# Patient Record
Sex: Male | Born: 1950 | Race: White | Hispanic: No | Marital: Married | Smoking: Former smoker
Health system: Southern US, Community
[De-identification: ages and names within clinical notes are randomized; demographics above are authoritative.]

## PROBLEM LIST (undated history)

## (undated) DIAGNOSIS — I10 Essential (primary) hypertension: Secondary | ICD-10-CM

## (undated) DIAGNOSIS — M1990 Unspecified osteoarthritis, unspecified site: Secondary | ICD-10-CM

## (undated) DIAGNOSIS — G5603 Carpal tunnel syndrome, bilateral upper limbs: Secondary | ICD-10-CM

## (undated) DIAGNOSIS — R7303 Prediabetes: Secondary | ICD-10-CM

## (undated) DIAGNOSIS — R918 Other nonspecific abnormal finding of lung field: Secondary | ICD-10-CM

## (undated) HISTORY — DX: Carpal tunnel syndrome, bilateral upper limbs: G56.03

## (undated) HISTORY — DX: Other nonspecific abnormal finding of lung field: R91.8

---

## 2009-07-28 ENCOUNTER — Encounter: Admission: RE | Admit: 2009-07-28 | Discharge: 2009-07-28 | Payer: Self-pay | Admitting: Family Medicine

## 2009-08-08 ENCOUNTER — Ambulatory Visit: Payer: Self-pay | Admitting: Internal Medicine

## 2009-08-08 DIAGNOSIS — J4489 Other specified chronic obstructive pulmonary disease: Secondary | ICD-10-CM | POA: Insufficient documentation

## 2009-08-08 DIAGNOSIS — R222 Localized swelling, mass and lump, trunk: Secondary | ICD-10-CM

## 2009-08-08 DIAGNOSIS — J189 Pneumonia, unspecified organism: Secondary | ICD-10-CM

## 2009-08-08 DIAGNOSIS — J449 Chronic obstructive pulmonary disease, unspecified: Secondary | ICD-10-CM

## 2009-08-08 DIAGNOSIS — E785 Hyperlipidemia, unspecified: Secondary | ICD-10-CM

## 2009-08-11 ENCOUNTER — Encounter: Payer: Self-pay | Admitting: Internal Medicine

## 2009-08-11 ENCOUNTER — Ambulatory Visit: Admission: RE | Admit: 2009-08-11 | Discharge: 2009-08-11 | Payer: Self-pay | Admitting: Internal Medicine

## 2009-08-11 ENCOUNTER — Ambulatory Visit: Payer: Self-pay | Admitting: Internal Medicine

## 2009-08-15 ENCOUNTER — Telehealth: Payer: Self-pay | Admitting: Internal Medicine

## 2009-10-09 ENCOUNTER — Telehealth (INDEPENDENT_AMBULATORY_CARE_PROVIDER_SITE_OTHER): Payer: Self-pay | Admitting: *Deleted

## 2009-10-18 ENCOUNTER — Ambulatory Visit (HOSPITAL_COMMUNITY): Admission: RE | Admit: 2009-10-18 | Discharge: 2009-10-18 | Payer: Self-pay | Admitting: Internal Medicine

## 2009-10-25 ENCOUNTER — Encounter: Payer: Self-pay | Admitting: Internal Medicine

## 2009-10-25 ENCOUNTER — Ambulatory Visit: Payer: Self-pay | Admitting: Thoracic Surgery

## 2009-10-30 ENCOUNTER — Ambulatory Visit (HOSPITAL_COMMUNITY): Admission: RE | Admit: 2009-10-30 | Discharge: 2009-10-30 | Payer: Self-pay | Admitting: Thoracic Surgery

## 2009-11-03 ENCOUNTER — Encounter: Payer: Self-pay | Admitting: Thoracic Surgery

## 2009-11-03 ENCOUNTER — Inpatient Hospital Stay (HOSPITAL_COMMUNITY): Admission: RE | Admit: 2009-11-03 | Discharge: 2009-11-07 | Payer: Self-pay | Admitting: Thoracic Surgery

## 2009-11-03 ENCOUNTER — Ambulatory Visit: Payer: Self-pay | Admitting: Thoracic Surgery

## 2009-11-14 ENCOUNTER — Ambulatory Visit: Payer: Self-pay | Admitting: Thoracic Surgery

## 2009-11-14 ENCOUNTER — Encounter: Admission: RE | Admit: 2009-11-14 | Discharge: 2009-11-14 | Payer: Self-pay | Admitting: Thoracic Surgery

## 2009-12-06 ENCOUNTER — Ambulatory Visit: Payer: Self-pay | Admitting: Thoracic Surgery

## 2009-12-06 ENCOUNTER — Encounter: Admission: RE | Admit: 2009-12-06 | Discharge: 2009-12-06 | Payer: Self-pay | Admitting: Thoracic Surgery

## 2010-12-09 ENCOUNTER — Encounter: Payer: Self-pay | Admitting: Thoracic Surgery

## 2010-12-09 ENCOUNTER — Encounter: Payer: Self-pay | Admitting: Internal Medicine

## 2010-12-25 ENCOUNTER — Other Ambulatory Visit (HOSPITAL_COMMUNITY): Payer: Self-pay | Admitting: Urology

## 2010-12-25 DIAGNOSIS — C61 Malignant neoplasm of prostate: Secondary | ICD-10-CM

## 2011-01-18 ENCOUNTER — Other Ambulatory Visit (HOSPITAL_COMMUNITY): Payer: Self-pay

## 2011-02-18 LAB — GLUCOSE, CAPILLARY
Glucose-Capillary: 109 mg/dL — ABNORMAL HIGH (ref 70–99)
Glucose-Capillary: 110 mg/dL — ABNORMAL HIGH (ref 70–99)
Glucose-Capillary: 123 mg/dL — ABNORMAL HIGH (ref 70–99)
Glucose-Capillary: 133 mg/dL — ABNORMAL HIGH (ref 70–99)
Glucose-Capillary: 136 mg/dL — ABNORMAL HIGH (ref 70–99)

## 2011-02-18 LAB — COMPREHENSIVE METABOLIC PANEL
ALT: 19 U/L (ref 0–53)
Alkaline Phosphatase: 60 U/L (ref 39–117)
BUN: 3 mg/dL — ABNORMAL LOW (ref 6–23)
CO2: 27 mEq/L (ref 19–32)
GFR calc non Af Amer: >60 mL/min (ref >60)
Glucose, Bld: 132 mg/dL — ABNORMAL HIGH (ref 70–99)
Potassium: 3.5 mEq/L (ref 3.5–5.1)
Sodium: 133 mEq/L — ABNORMAL LOW (ref 135–145)

## 2011-02-18 LAB — CBC
HCT: 40 % (ref 39.0–52.0)
Hemoglobin: 13.5 g/dL (ref 13.0–17.0)
Hemoglobin: 13.6 g/dL (ref 13.0–17.0)
MCHC: 34 g/dL (ref 30.0–36.0)
MCV: 90 fL (ref 78.0–100.0)
Platelets: 199 10*3/uL (ref 150–400)
RBC: 4.43 MIL/uL (ref 4.22–5.81)
RBC: 4.44 MIL/uL (ref 4.22–5.81)
RBC: 4.51 MIL/uL (ref 4.22–5.81)
RDW: 13.6 % (ref 11.5–15.5)
RDW: 14.2 % (ref 11.5–15.5)
WBC: 9.8 10*3/uL (ref 4.0–10.5)

## 2011-02-18 LAB — POCT I-STAT 3, ART BLOOD GAS (G3+)
Patient temperature: 98.6
TCO2: 29 mmol/L (ref 0–100)
pCO2 arterial: 47.8 mmHg — ABNORMAL HIGH (ref 35.0–45.0)
pH, Arterial: 7.372 (ref 7.350–7.450)
pO2, Arterial: 54 mmHg — ABNORMAL LOW (ref 80.0–100.0)

## 2011-02-18 LAB — BASIC METABOLIC PANEL
CO2: 25 mEq/L (ref 19–32)
Calcium: 8.1 mg/dL — ABNORMAL LOW (ref 8.4–10.5)
GFR calc Af Amer: >60 mL/min (ref >60)
GFR calc Af Amer: >60 mL/min (ref >60)
GFR calc non Af Amer: >60 mL/min (ref >60)
Potassium: 4.2 mEq/L (ref 3.5–5.1)
Sodium: 136 mEq/L (ref 135–145)
Sodium: 137 mEq/L (ref 135–145)

## 2011-02-18 LAB — TYPE AND SCREEN
ABO/RH(D): A POS
Antibody Screen: NEGATIVE

## 2011-02-18 LAB — MRSA PCR SCREENING: MRSA by PCR: NEGATIVE

## 2011-02-19 LAB — COMPREHENSIVE METABOLIC PANEL
ALT: 34 U/L (ref 0–53)
AST: 21 U/L (ref 0–37)
Albumin: 3.9 g/dL (ref 3.5–5.2)
Alkaline Phosphatase: 62 U/L (ref 39–117)
BUN: 14 mg/dL (ref 6–23)
CO2: 21 mEq/L (ref 19–32)
Calcium: 9.2 mg/dL (ref 8.4–10.5)
Chloride: 106 mEq/L (ref 96–112)
Creatinine, Ser: 0.83 mg/dL (ref 0.4–1.5)
GFR calc Af Amer: >60 mL/min (ref >60)
GFR calc non Af Amer: >60 mL/min (ref >60)
Glucose, Bld: 113 mg/dL — ABNORMAL HIGH (ref 70–99)
Potassium: 4.4 mEq/L (ref 3.5–5.1)
Sodium: 135 mEq/L (ref 135–145)
Total Bilirubin: 0.6 mg/dL (ref 0.3–1.2)
Total Protein: 6.3 g/dL (ref 6.0–8.3)

## 2011-02-19 LAB — URINALYSIS, ROUTINE W REFLEX MICROSCOPIC
Bilirubin Urine: NEGATIVE
Ketones, ur: NEGATIVE mg/dL
Nitrite: NEGATIVE
Protein, ur: NEGATIVE mg/dL
Urobilinogen, UA: 0.2 mg/dL (ref 0.0–1.0)
pH: 6 (ref 5.0–8.0)

## 2011-02-19 LAB — BLOOD GAS, ARTERIAL
Bicarbonate: 24.9 mEq/L — ABNORMAL HIGH (ref 20.0–24.0)
FIO2: 0.21 %
Patient temperature: 98.6
pH, Arterial: 7.402 (ref 7.350–7.450)

## 2011-02-19 LAB — APTT: aPTT: 30 seconds (ref 24–37)

## 2011-02-19 LAB — CBC
HCT: 47.4 % (ref 39.0–52.0)
Hemoglobin: 16.2 g/dL (ref 13.0–17.0)
MCHC: 34.1 g/dL (ref 30.0–36.0)
MCV: 89.3 fL (ref 78.0–100.0)
Platelets: 224 10*3/uL (ref 150–400)
RBC: 5.31 MIL/uL (ref 4.22–5.81)
RDW: 13.9 % (ref 11.5–15.5)
WBC: 9.5 10*3/uL (ref 4.0–10.5)

## 2011-04-02 NOTE — Letter (Signed)
October 25, 2009   Rodney Frey. Sherene Sires, MD, FCCP  [address]  [place], [place] [address]   Re:  Rodney Frey, Rodney Frey               DOB:  02/29/1951   Dear Kathlene November:   I appreciate the opportunity of seeing the patient.  This 60 year old  man is a smoker that he smokes 1 pack per day.  In late August, he had a  chest x-ray that showed abnormal area in his right middle lobe and  underwent a CT scan, which showed a right middle lobe lesion.  He had a  bronchoscopy done on August 11, 2009.  On the CT scan, there is a  question of a hepatic nodule.  The bronchoscopy revealed neuroendocrine  neoplasm versus probably a low-grade or carcinoid-type tumor.  He then  underwent a PET scan, which was done on October 18, 2009, and then he  was referred to [place] for evaluation.  The PET scan showed moderate  hypermetabolic right middle lobe mass consistent with a neoplasm.  Both  left and right paratracheal node activities and no uptake in the liver  lesion.  His mass having a standard uptake value of 4.3.  He has had no  hemoptysis, fever, chills, or excessive sputum.   MEDICATIONS:  He has been on no medication.  He was on Pravachol.   ALLERGIES:  He has no allergies.   PAST MEDICAL HISTORY:  He told he has had hyperlipidemia and a history  of pneumonia.   FAMILY HISTORY:  Noncontributory.   SOCIAL HISTORY:  He is married.  Drinks beer 3 times a week, smokes 1  pack of cigarettes per day.   REVIEW OF SYSTEMS:  VITAL SIGNS:  He is 185 pounds.  He is 5 foot 8  inches.  GENERAL:  His weight has been stable.  CARDIAC:  No angina or atrial fibrillation.  PULMONARY:  No hemoptysis, asthma, or wheezing.  See history of present  illness.  GI:  No nausea, vomiting, constipation, or diarrhea.  GU:  No kidney disease, dysuria, or frequent urination.  VASCULAR:  No claudication, DVT, or TIAs.  NEUROLOGICAL:  No dizziness, headaches, blackouts, or seizures.  MUSCULOSKELETAL:  No arthritis.  PSYCHIATRIC:   No depression or nervous.  ENT:  No changes in eyesight or hearing.  HEMATOLOGIC:  No problems with bleeding, clotting disorders, or anemia.   PHYSICAL EXAMINATION:  General:  A well-developed Caucasian male, in no  acute distress.  Head, Eyes, Ears, Nose, and Throat:  Unremarkable.  He  does have a full beard.  Neck:  Supple without thyromegaly.  There is no  supraclavicular or axillary adenopathy.  Chest:  Clear to auscultation  and percussion.  Heart:  Regular sinus rhythm.  No murmurs.  Abdomen:  Soft.  There is no hepatosplenomegaly.  Extremities:  Pulses are 2+.  There is no clubbing or edema.  Neurological:  He is oriented x3.  Sensory and motor intact.  Cranial nerves intact.   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  I hope that there has been no metastatic spread to his lymph nodes given  that this has been there for a while, and that there is low uptake on  his PET scan.  One of the case is the procedure towards his right middle  lobectomy with extensive lymph node dissection.  This is very poorly  responsive to chemotherapy and/or radiation.  I discussed  this in detail  with the patient who is elected to do a surgery on November 03, 2009 at  [facility].   Sincerely,   Ines Bloomer, M.D.  Electronically Signed   DPB/MEDQ  D:  10/25/2009  T:  10/26/2009  Job:  [id number]

## 2011-04-02 NOTE — Assessment & Plan Note (Signed)
OFFICE VISIT   Rodney Frey, Rodney Frey  DOB:  10/29/1951                                        December 06, 2009  CHART #:  [id number]   The patient came for followup today.  His incision is well healed.  His  pain has decreased and I have released him to full activity.  His blood  pressure was 134/84, pulse 82, respirations 18, and sats were 93%.  His  lungs are clear to auscultation and percussion and is in normal sinus  rhythm.  Overall, he is doing well; taking occasional pain pill for some  mild pain.  We will see him back again in 2 months with a chest x-ray.   Ines Bloomer, M.D.  Electronically Signed   DPB/MEDQ  D:  12/06/2009  T:  12/06/2009  Job:  [id number]

## 2011-04-05 NOTE — Letter (Signed)
November 15, 2009   Casimiro Needle B. Sherene Sires, MD, FCCP  [address]  [place], [place] [address]   Re:  LAVALLE, SKODA               DOB:  Feb 02, 1951   Dear Kathlene November,   I saw the patient back today after his right middle lobe VATS, right  middle lobectomy.  He is doing well, removed his chest tube sutures.  His blood pressure is 131/85, pulse 68, respirations 18, sats were 97%.  The chest x-ray as I said was stable.  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  We told him to gradually increase his  activities, and we will see him back again in 3 weeks with a chest x-  ray.   Ines Bloomer, M.D.  Electronically Signed   DPB/MEDQ  D:  11/15/2009  T:  11/16/2009  Job:  [id number]

## 2011-10-24 IMAGING — CR DG CHEST 2V
2 series · 2 of 2 positions shown · non-contrast
Comparison: CT 07/28/2009.

CLINICAL DATA: History of carcinoid of the middle lobe of the right
lung.  History of tobacco smoking. Preoperative cardiopulmonary
evaluation.

CHEST - 2 VIEW

[view not recorded (1 of 2)]
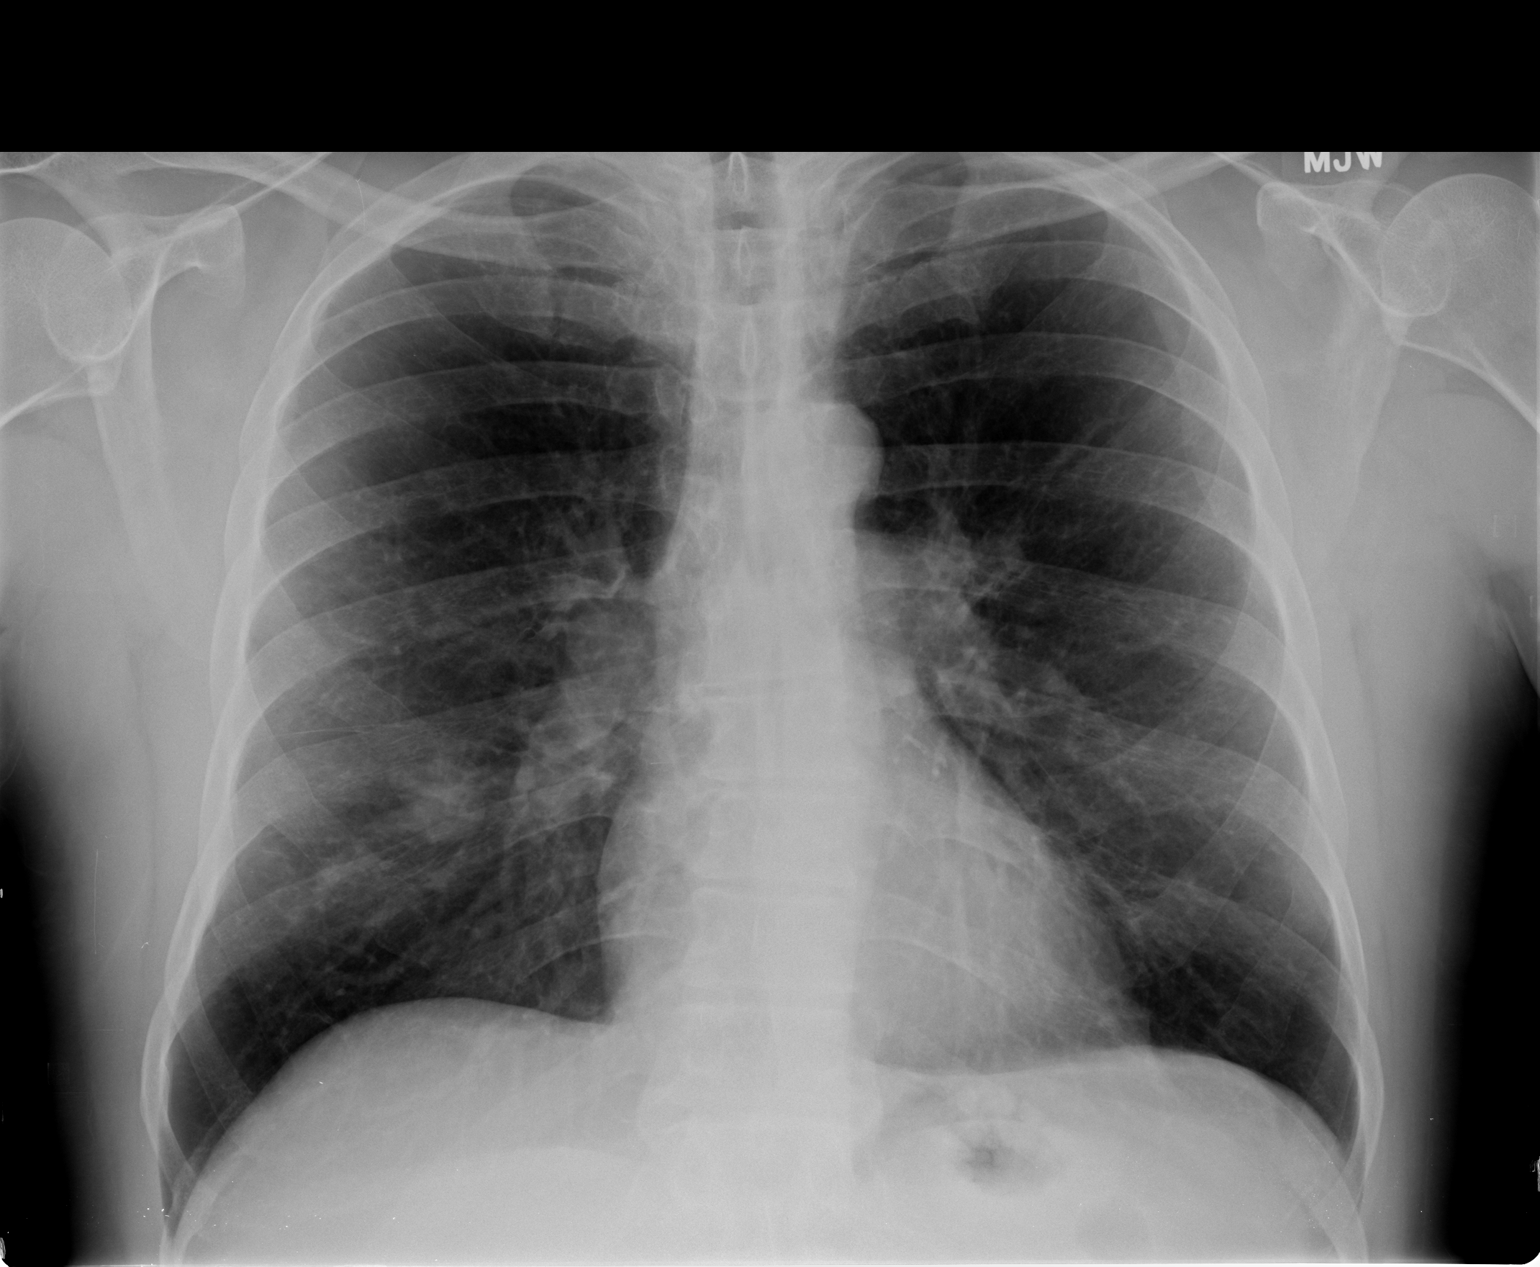

[view not recorded (2 of 2)]
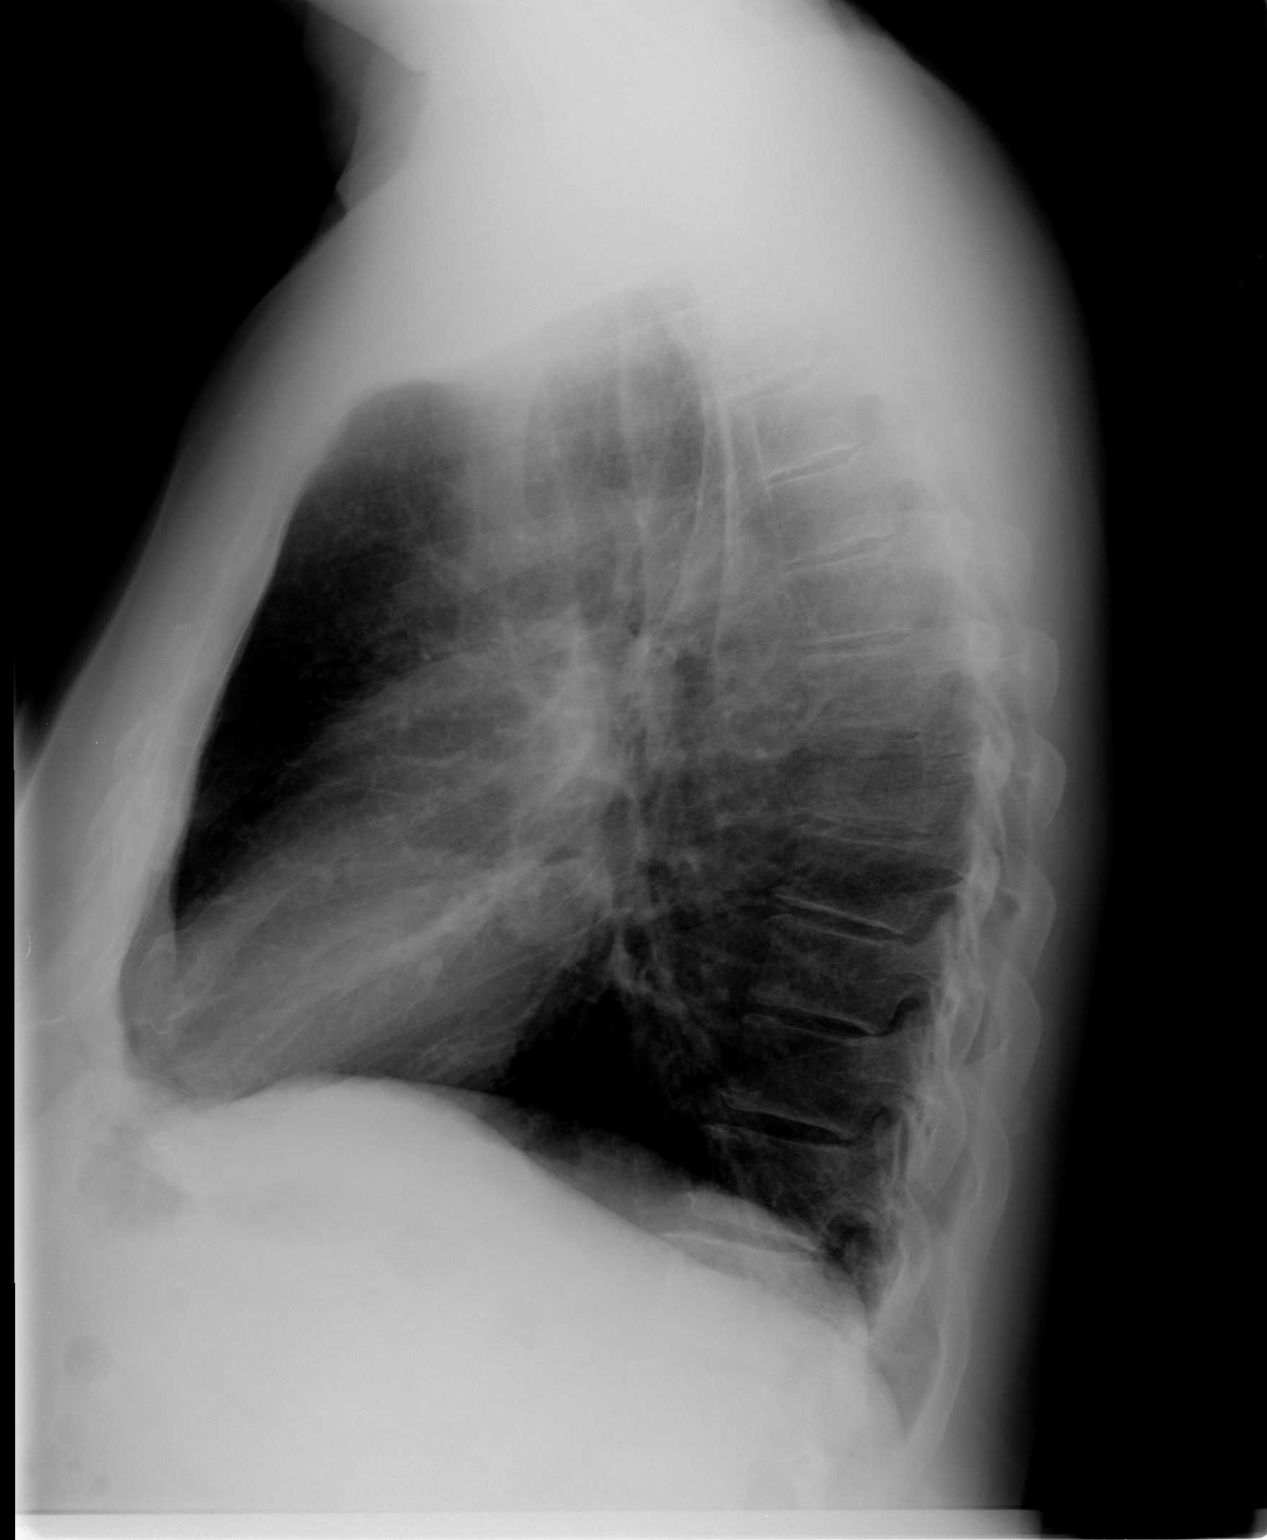

[2 of 2 positions shown; findings below may reference images not displayed]

FINDINGS: Nodular opacity is seen in the right middle lobe
medially.  On the PA image it measures 2.9 x 2.4 cm.  On the
lateral images is seen and measures 3.3 x 2.6 cm.  On previous CT
examination of 07/28/2009 was measured as 3.8 x 2.1 x 1.8 cm.  No
pulmonary edema, pneumonia, or pleural effusion is evident.  There
is slight flattening of the diaphragm on lateral image with
hyperinflation and evidence for obstructive pulmonary disease.  No
skeletal lesion is seen.
IMPRESSION: Nodular mass density in right middle lobe is again seen. The lungs
appear hyperinflated consistent with obstructive pulmonary disease.

## 2011-11-19 HISTORY — PX: LUNG SURGERY: SHX703

## 2012-04-23 ENCOUNTER — Ambulatory Visit (INDEPENDENT_AMBULATORY_CARE_PROVIDER_SITE_OTHER): Payer: 59 | Admitting: Family Medicine

## 2012-04-23 ENCOUNTER — Ambulatory Visit: Payer: 59

## 2012-04-23 VITALS — BP 158/98 | HR 60 | Temp 98.1°F | Resp 18 | Ht 69.0 in | Wt 174.0 lb

## 2012-04-23 DIAGNOSIS — M25539 Pain in unspecified wrist: Secondary | ICD-10-CM

## 2012-04-23 DIAGNOSIS — M25531 Pain in right wrist: Secondary | ICD-10-CM

## 2012-04-23 DIAGNOSIS — R200 Anesthesia of skin: Secondary | ICD-10-CM

## 2012-04-23 DIAGNOSIS — M79671 Pain in right foot: Secondary | ICD-10-CM

## 2012-04-23 DIAGNOSIS — M25511 Pain in right shoulder: Secondary | ICD-10-CM

## 2012-04-23 DIAGNOSIS — G629 Polyneuropathy, unspecified: Secondary | ICD-10-CM

## 2012-04-23 DIAGNOSIS — M25579 Pain in unspecified ankle and joints of unspecified foot: Secondary | ICD-10-CM

## 2012-04-23 DIAGNOSIS — G609 Hereditary and idiopathic neuropathy, unspecified: Secondary | ICD-10-CM

## 2012-04-23 DIAGNOSIS — M25519 Pain in unspecified shoulder: Secondary | ICD-10-CM

## 2012-04-23 LAB — TSH: TSH: 2.867 u[IU]/mL (ref 0.350–4.500)

## 2012-04-23 LAB — VITAMIN B12: Vitamin B-12: 490 pg/mL (ref 211–911)

## 2012-04-23 MED ORDER — TRAMADOL HCL 50 MG PO TABS: 50.0000 mg | ORAL_TABLET | Freq: Four times a day (QID) | ORAL | Status: AC | PRN

## 2012-04-23 NOTE — Patient Instructions (Addendum)
Keep a record of your blood pressures outside of the office and bring them to the next office visit. Tylenol as needed for pain, or if needing something stronger - can take tramadol up to every 6 hours as needed.  Recheck in next 1 week, sooner if any worsening.    Wrist brace as needed, exercises and range of motion exercises for shoulder.

## 2012-04-23 NOTE — Progress Notes (Signed)
  Subjective:    Patient ID: Rodney Frey, male    DOB: 1951/01/01, 61 y.o.   MRN: [id number]  HPI Rodney Frey is a 60 y.o. male  R ankle and R  Foot pain - felt like was swollen few months ago. NKI.  swellling initally in ankle and toe area. Pain on and off or few months.  Feels worse today.  R knee pain - 3 months ago - squatting down  - felt a pop when standing up.  Knee pain - improved, but foot feels worse. No known hx of Gout. Foot numb in toes, ball of footr and heel area. Pain in front of ankle with sitting, pain in ankle with standing, and sore behind ankle when walking.      No known hx of HTN, but no primary care doctor.  No prior eval for any of current issues. no recent blood work or physical - last one here 2 years ago.    R wrist tendonitis/pain - for years,  No recent injury.  Had scaphoid fx 40 years ago. 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  Able to lift now, but sore with certain mvmts at times.  R hand dominant.  No hx of seizure d/o.  Tx:   Advil, otc alleve as needed - about once every 2 days.    SH: 1 pack per day, manufacturing - on feet all day.    Review of Systems  Per hpi.       Objective:   Physical Exam  Constitutional: He appears well-developed and well-nourished.  HENT:  Head: Normocephalic and atraumatic.  Pulmonary/Chest: Effort normal.  Musculoskeletal:       Right shoulder: He exhibits normal range of motion, no tenderness and no bony tenderness.       Right wrist: He exhibits decreased range of motion. He exhibits no tenderness, no bony tenderness, no swelling and no effusion.       Right ankle: He exhibits normal range of motion and no ecchymosis. No lateral malleolus and no medial malleolus tenderness found. Achilles tendon normal. Achilles tendon exhibits no pain and no defect.       Arms:      Feet:    [facility] reading (PRIMARY) by  Dr. Neva Seat:  R shoulder: NAD R  Wrist:? Subacute/remote scaphoid fx. R ankle/foot:NAD  Results for orders placed in visit on 04/23/12  GLUCOSE, POCT (MANUAL RESULT ENTRY)      Component Value Range   POC Glucose 84  70 - 99 (mg/dl)        Assessment & Plan:  Rodney Frey is a 61 y.o. male  R foot/ankle pain with dyesthesias.  Exam concerning for peripheral vascular disease as cause of neuropathy.  Will check TSH, B12 level, uric acid, and refer for ABI's.  Recheck next 2 weeks.  Advised on cutting back on smoking and need for cessation, especially if PVD.    R wrist pain - likely underlying OA with prior scaphoid fx.with secondary tendonitis. Trial of brace during work, rom, ice/heat at night.  Hold on NSAIDS for now with elevated BP.  R shoulder pain - ? RTC tendonitis.  H/o on HEp.  rom exercises. Tylenol or ultram prn and recheck next few weeks.    Elevated BP without hx HTN - check outside bp's and recheck next week.

## 2012-04-24 ENCOUNTER — Ambulatory Visit
Admission: RE | Admit: 2012-04-24 | Discharge: 2012-04-24 | Disposition: A | Discharge status: Home / Self Care | Payer: 59 | Source: Ambulatory Visit | Attending: Family Medicine | Admitting: Family Medicine

## 2012-04-24 ENCOUNTER — Other Ambulatory Visit: Payer: Self-pay | Admitting: Family Medicine

## 2012-04-24 DIAGNOSIS — G629 Polyneuropathy, unspecified: Secondary | ICD-10-CM

## 2012-05-05 ENCOUNTER — Telehealth: Payer: Self-pay

## 2012-05-05 NOTE — Telephone Encounter (Signed)
Patient requesting call back from Dr. Neva Seat to discuss results of arterial ultrasound.

## 2012-05-05 NOTE — Telephone Encounter (Signed)
Spoke with patient and discussed results, advised follow-up with Dr. Neva Seat to advise further.  Patient will try and RTC Thursday.

## 2013-12-18 ENCOUNTER — Ambulatory Visit (INDEPENDENT_AMBULATORY_CARE_PROVIDER_SITE_OTHER): Payer: 59 | Admitting: Internal Medicine

## 2013-12-18 ENCOUNTER — Ambulatory Visit: Payer: 59

## 2013-12-18 VITALS — BP 124/82 | HR 86 | Temp 98.1°F | Resp 16 | Ht 65.5 in | Wt 148.2 lb

## 2013-12-18 DIAGNOSIS — M25512 Pain in left shoulder: Principal | ICD-10-CM

## 2013-12-18 DIAGNOSIS — J449 Chronic obstructive pulmonary disease, unspecified: Secondary | ICD-10-CM

## 2013-12-18 DIAGNOSIS — J9801 Acute bronchospasm: Secondary | ICD-10-CM

## 2013-12-18 DIAGNOSIS — M25511 Pain in right shoulder: Principal | ICD-10-CM

## 2013-12-18 DIAGNOSIS — G8929 Other chronic pain: Secondary | ICD-10-CM

## 2013-12-18 DIAGNOSIS — J209 Acute bronchitis, unspecified: Secondary | ICD-10-CM

## 2013-12-18 DIAGNOSIS — J4489 Other specified chronic obstructive pulmonary disease: Secondary | ICD-10-CM

## 2013-12-18 DIAGNOSIS — M25519 Pain in unspecified shoulder: Secondary | ICD-10-CM

## 2013-12-18 DIAGNOSIS — F17200 Nicotine dependence, unspecified, uncomplicated: Secondary | ICD-10-CM

## 2013-12-18 MED ORDER — PREDNISONE 10 MG PO TABS: 10.0000 mg | ORAL_TABLET | Freq: Every day | ORAL | Status: DC

## 2013-12-18 MED ORDER — MELOXICAM 15 MG PO TABS: 15.0000 mg | ORAL_TABLET | Freq: Every day | ORAL | Status: DC

## 2013-12-18 MED ORDER — HYDROCODONE-HOMATROPINE 5-1.5 MG/5ML PO SYRP: 5.0000 mL | ORAL_SOLUTION | Freq: Four times a day (QID) | ORAL | Status: DC | PRN

## 2013-12-18 MED ORDER — ALBUTEROL SULFATE HFA 108 (90 BASE) MCG/ACT IN AERS: 2.0000 | INHALATION_SPRAY | Freq: Four times a day (QID) | RESPIRATORY_TRACT | Status: DC | PRN

## 2013-12-18 NOTE — Progress Notes (Signed)
Subjective:    Patient ID: Rodney Frey, male    DOB: November 24, 1950, 64 y.o.   MRN: [id number] This chart was scribed for Rodney Lin, MD by Vernell Barrier, Medical Scribe. This patient's care was started at 3:00 PM.  Shoulder Pain    HPI Comments: Rodney Frey is a 63 y.o. male who presents to the [facility] complaining of right shoulder pain w/ some occasional numbness and tingling Pt states he has had chronic bursitis for 2 years. Pain initially started in the left shoulder but has since calmed down; denies any injury or specific incident that cause this pain. Right shoulder started 2 years ago after pt went to lift a gallon of milk.Yesterday, pt states left shoulder pain was worse than right; rating pain a 9/10. Pt has to sleep a certain position to avoid recreating pain. Pt admits he has lost some stregth in both shoulders since initial pain began. Pt says he is unable to abduct above the shoulder. Pain when lifting right arm but states pain subsides once he has it lifted. Pt was taking an herbal medication for 6 weeks that was providing significant relief, until 1 day ago when pain became unbearable. 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  Pt also has right wrist pain. Pt has difficulty performing flexion and extension.  Pt also reports congestion, sinus HA, productive cough, onset 10 days ago. Took OTC medication and feels relief but states congestion is still present. Pt is a chronic smoker.   Review of Systems  Objective:   Physical Exam  Vitals reviewed. Constitutional: He is oriented to person, place, and time. He appears well-developed and well-nourished. No distress.  HENT:  Head: Normocephalic and atraumatic.  Nose: Nose normal.  Mouth/Throat: Oropharynx is clear and moist.  Eyes: Conjunctivae and EOM are normal.  Neck: Neck supple. No thyromegaly present.  Cardiovascular: Normal rate.   Pulmonary/Chest: Effort normal. No respiratory  distress. He has wheezes. He has no rales.  Mild wheezing with forced expiration only  Musculoskeletal: Normal range of motion.       Right shoulder: He exhibits decreased strength.  Both shoulders have limited ROM.   RIGHT: No pain with external rotation. Pain with internal rotation and abduction. No sensory or motor loss of right hand.   LEFT: Similar findings as right but not as intense  Lymphadenopathy:    He has no cervical adenopathy.  Neurological: He is alert and oriented to person, place, and time.  Skin: Skin is warm and dry.  Psychiatric: He has a normal mood and affect. His behavior is normal.   [facility] reading (PRIMARY) by Dr. Laney Pastor: both shoulders appear normal without evidence of degenerative changes.   Assessment & Plan:  I have completed the patient encounter in its entirety as documented by the scribe, with editing by me where necessary. Alphonsus Doyel P. Laney Pastor, M.D. Chronic pain of both shoulders - Ambulatory referral to Orthopedic Surgery--- he will require extensive physical therapy and possibly injections/there may be a tear in the right rotator cuff  Nicotine addiction--he's not interested in stopping  Acute bronchospasm--- combination illness plus COPD from smoking  COPD UNSPECIFIED  Acute bronchitis--acute viral  Meds ordered this encounter  Medications  . predniSONE (DELTASONE) 10 MG tablet    Sig: Take 1 tablet (10 mg total) by mouth daily with breakfast.    Dispense:  12 tablet    Refill:  0  . meloxicam (MOBIC) 15 MG tablet  Sig: Take 1 tablet (15 mg total) by mouth daily.    Dispense:  30 tablet    Refill:  0  . albuterol (PROVENTIL HFA;VENTOLIN HFA) 108 (90 BASE) MCG/ACT inhaler    Sig: Inhale 2 puffs into the lungs every 6 (six) hours as needed for wheezing or shortness of breath.    Dispense:  1 Inhaler    Refill:  0  . HYDROcodone-homatropine (HYCODAN) 5-1.5 MG/5ML syrup    Sig: Take 5 mLs by mouth every 6 (six) hours as needed for cough.      Dispense:  120 mL    Refill:  0

## 2013-12-26 ENCOUNTER — Telehealth: Payer: Self-pay

## 2013-12-26 NOTE — Telephone Encounter (Signed)
Patient notified XR disc ready for pick up

## 2013-12-26 NOTE — Telephone Encounter (Signed)
Patient needs to pick up xrays that were done recently please call patient when ready

## 2014-01-01 ENCOUNTER — Telehealth: Payer: Self-pay

## 2014-01-01 NOTE — Telephone Encounter (Signed)
PATIENT SAYS THAT THE PREDNISONE AND MELOXICAM WE PRESCRIBED HIM IS CAUSING BLOOD IN HIS STOOL WOULD LIKE NURSE OR ASSISTANT TO CALL HIM AT [phone]

## 2014-01-02 NOTE — Telephone Encounter (Signed)
Pt notified to stop medications.  He stated that he has already stopped and the blood in stool stopped.  He is now taking ibuprofen.  Advised pt per Nira Conn to stop the ibuprofen and take Tylenol 1 to 2 tabs as needed.

## 2014-01-02 NOTE — Telephone Encounter (Signed)
lmom to cb. 

## 2014-01-02 NOTE — Telephone Encounter (Signed)
If he has blood in his stool he needs to come in. Stop the prednisone and mobic.

## 2014-01-02 NOTE — Telephone Encounter (Signed)
Please advise 

## 2014-01-12 ENCOUNTER — Other Ambulatory Visit: Payer: Self-pay | Admitting: Orthopedic Surgery

## 2014-01-12 DIAGNOSIS — M542 Cervicalgia: Secondary | ICD-10-CM

## 2014-01-12 DIAGNOSIS — M25512 Pain in left shoulder: Principal | ICD-10-CM

## 2014-01-12 DIAGNOSIS — M25511 Pain in right shoulder: Secondary | ICD-10-CM

## 2014-01-18 ENCOUNTER — Ambulatory Visit
Admission: RE | Admit: 2014-01-18 | Discharge: 2014-01-18 | Disposition: A | Discharge status: Home / Self Care | Payer: PRIVATE HEALTH INSURANCE | Source: Ambulatory Visit | Attending: Orthopedic Surgery | Admitting: Orthopedic Surgery

## 2014-01-18 ENCOUNTER — Other Ambulatory Visit: Payer: 59

## 2014-01-18 ENCOUNTER — Other Ambulatory Visit: Payer: Self-pay | Admitting: Orthopedic Surgery

## 2014-01-18 DIAGNOSIS — M25511 Pain in right shoulder: Secondary | ICD-10-CM

## 2014-01-18 DIAGNOSIS — M542 Cervicalgia: Secondary | ICD-10-CM

## 2014-01-18 DIAGNOSIS — M25512 Pain in left shoulder: Principal | ICD-10-CM

## 2014-01-19 ENCOUNTER — Ambulatory Visit
Admission: RE | Admit: 2014-01-19 | Discharge: 2014-01-19 | Disposition: A | Discharge status: Home / Self Care | Payer: PRIVATE HEALTH INSURANCE | Source: Ambulatory Visit | Attending: Orthopedic Surgery | Admitting: Orthopedic Surgery

## 2014-01-19 DIAGNOSIS — M25511 Pain in right shoulder: Secondary | ICD-10-CM

## 2014-01-19 DIAGNOSIS — M542 Cervicalgia: Secondary | ICD-10-CM

## 2014-01-19 DIAGNOSIS — M25512 Pain in left shoulder: Principal | ICD-10-CM

## 2014-02-09 ENCOUNTER — Other Ambulatory Visit: Payer: Self-pay | Admitting: Internal Medicine

## 2014-02-10 ENCOUNTER — Other Ambulatory Visit: Payer: Self-pay | Admitting: Internal Medicine

## 2014-08-17 ENCOUNTER — Ambulatory Visit (INDEPENDENT_AMBULATORY_CARE_PROVIDER_SITE_OTHER): Payer: 59 | Admitting: Emergency Medicine

## 2014-08-17 VITALS — BP 124/80 | HR 65 | Temp 98.7°F | Resp 16 | Ht 67.0 in | Wt 160.2 lb

## 2014-08-17 DIAGNOSIS — J018 Other acute sinusitis: Secondary | ICD-10-CM

## 2014-08-17 DIAGNOSIS — J4489 Other specified chronic obstructive pulmonary disease: Secondary | ICD-10-CM

## 2014-08-17 DIAGNOSIS — J209 Acute bronchitis, unspecified: Secondary | ICD-10-CM

## 2014-08-17 DIAGNOSIS — J449 Chronic obstructive pulmonary disease, unspecified: Secondary | ICD-10-CM

## 2014-08-17 MED ORDER — IPRATROPIUM BROMIDE 0.02 % IN SOLN
0.5000 mg | Freq: Once | RESPIRATORY_TRACT | Status: AC
  Administered 2014-08-17: 0.5 mg via RESPIRATORY_TRACT

## 2014-08-17 MED ORDER — ALBUTEROL SULFATE (2.5 MG/3ML) 0.083% IN NEBU
5.0000 mg | INHALATION_SOLUTION | Freq: Once | RESPIRATORY_TRACT | Status: AC
  Administered 2014-08-17: 5 mg via RESPIRATORY_TRACT

## 2014-08-17 MED ORDER — PROMETHAZINE-CODEINE 6.25-10 MG/5ML PO SYRP: 5.0000 mL | ORAL_SOLUTION | Freq: Four times a day (QID) | ORAL | Status: DC | PRN

## 2014-08-17 MED ORDER — PSEUDOEPHEDRINE-GUAIFENESIN ER 60-600 MG PO TB12: 1.0000 | ORAL_TABLET | Freq: Two times a day (BID) | ORAL | Status: AC

## 2014-08-17 MED ORDER — MELOXICAM 15 MG PO TABS: ORAL_TABLET | ORAL | Status: DC

## 2014-08-17 MED ORDER — AMOXICILLIN-POT CLAVULANATE 875-125 MG PO TABS: 1.0000 | ORAL_TABLET | Freq: Two times a day (BID) | ORAL | Status: DC

## 2014-08-17 MED ORDER — ALBUTEROL SULFATE HFA 108 (90 BASE) MCG/ACT IN AERS: 2.0000 | INHALATION_SPRAY | RESPIRATORY_TRACT | Status: DC | PRN

## 2014-08-17 NOTE — Progress Notes (Signed)
[facility] [address] [place] [address] [phone]  Date:  08/17/2014   Name:  Rodney Frey   DOB:  Jul 04, 1951   MRN:  [id number]  PCP:  Leandrew Koyanagi, MD    Chief Complaint: Nasal Congestion and chest congestion   History of Present Illness:  Rodney Frey is a 63 y.o. very pleasant male patient who presents with the following:  Working under house and was rather damp and moldy. Now has nasal congestion and post nasal drainage that is purulent in nature. Wheezing.  Cough productive of purulent sputum.  No shortness of breath. No fever or chills. No nausea or vomiting. No stool change Has cut back dramatically on his smoking. No improvement with over the counter medications or other home remedies. Denies other complaint or health concern today.   Patient Active Problem List   Diagnosis Date Noted  . Nicotine addiction 12/18/2013  . HYPERLIPIDEMIA 08/08/2009  . PNEUMONIA 08/08/2009  . COPD UNSPECIFIED 08/08/2009  . SWELLING/MASS/LUMP IN CHEST 08/08/2009    History reviewed. No pertinent past medical history.  History reviewed. No pertinent past surgical history.  History  Substance Use Topics  . Smoking status: Current Every Day Smoker -- 1.00 packs/day for 40 years    Types: Cigarettes  . Smokeless tobacco: Not on file  . Alcohol Use: Yes    History reviewed. No pertinent family history.  No Known Allergies  Medication list has been reviewed and updated.  Current Outpatient Prescriptions on File Prior to Visit  Medication Sig Dispense Refill  . albuterol (PROVENTIL HFA;VENTOLIN HFA) 108 (90 BASE) MCG/ACT inhaler Inhale 2 puffs into the lungs every 6 (six) hours as needed for wheezing or shortness of breath.  1 Inhaler  0  . HYDROcodone-homatropine (HYCODAN) 5-1.5 MG/5ML syrup Take 5 mLs by mouth every 6 (six) hours as needed for cough.  120 mL  0  . meloxicam (MOBIC) 15 MG tablet TAKE 1 TABLET (15 MG TOTAL) BY MOUTH  DAILY.  30 tablet  3  . predniSONE (DELTASONE) 10 MG tablet Take 1 tablet (10 mg total) by mouth daily with breakfast.  12 tablet  0   No current facility-administered medications on file prior to visit.    Review of Systems:  As per HPI, otherwise negative.    Physical Examination: Filed Vitals:   08/17/14 1518  BP: 124/80  Pulse: 65  Temp: 98.7 F (37.1 C)  Resp: 16   Filed Vitals:   08/17/14 1518  Height: 5\' 7"  (1.702 m)  Weight: 160 lb 3.2 oz (72.666 kg)   Body mass index is 25.08 kg/(m^2). Ideal Body Weight: Weight in (lb) to have BMI = 25: 159.3  GEN: WDWN, NAD, Non-toxic, A & O x 3 HEENT: Atraumatic, Normocephalic. Neck supple. No masses, No LAD. Ears and Nose: No external deformity. CV: RRR, No M/G/R. No JVD. No thrill. No extra heart sounds. PULM: bilateral wheezes, no crackles, rhonchi. No retractions. No resp. distress. No accessory muscle use. ABD: S, NT, ND, +BS. No rebound. No HSM. EXTR: No c/c/e NEURO Normal gait.  PSYCH: Normally interactive. Conversant. Not depressed or anxious appearing.  Calm demeanor.    Assessment and Plan: Exacerbation bronchitis Neb Tobacco abuse  Signed,  Ellison Carwin, MD

## 2014-08-17 NOTE — Patient Instructions (Signed)
Chronic Asthmatic Bronchitis Chronic asthmatic bronchitis is a complication of persistent asthma. After a period of time with asthma, some people develop airflow obstruction that is present all the time, even when not having an asthma attack.There is also persistent inflammation of the airways, and the bronchial tubes produce more mucus. Chronic asthmatic bronchitis usually is a permanent problem with the lungs. CAUSES  Chronic asthmatic bronchitis happens most often in people who have asthma and also smoke cigarettes. Occasionally, it can happen to a person with long-standing or severe asthma even if the person is not a smoker. SIGNS AND SYMPTOMS  Chronic asthmatic bronchitis usually causes symptoms of both asthma and chronic bronchitis, including:   Coughing.  Increased sputum production.  Wheezing and shortness of breath.  Chest discomfort.  Recurring infections. DIAGNOSIS  Your health care provider will take a medical history and perform a physical exam. Chronic asthmatic bronchitis is suspected when a person with asthma has abnormal results on breathing tests (pulmonary function tests) even when breathing symptoms are at their best. Other tests, such as a chest X-ray, may be performed to rule out other conditions.  TREATMENT  Treatment involves controlling symptoms with medicine and lifestyle changes.  Your health care provider may prescribe asthma medicines, including inhaler and nebulizer medicines.  Infection can be treated with medicine to kill germs (antibiotics). Serious infections may require hospitalization. These can include:  Pneumonia.  Sinus infections.  Acute bronchitis.   Preventing infection and hospitalization is very important. Get an influenza vaccination every year as directed by your health care provider. Ask your health care provider whether you need a pneumonia vaccine.  Ask your health care provider whether you would benefit from a pulmonary  rehabilitation program. HOME CARE INSTRUCTIONS  Take medicines only as directed by your health care provider.  If you are a cigarette smoker, the most important thing that you can do is quit. Talk to your health care provider for help with quitting smoking.  Avoid pollen, dust, animal dander, molds, smoke, and other things that cause attacks.  Regular exercise is very important to help you feel better. Discuss possible exercise routines with your health care provider.  If animal dander is the cause of asthma, you may not be able to keep pets.  It is important that you:  Become educated about your medical condition.  Participate in maintaining wellness.  Seek medical care as directed. Delay in seeking medical care could cause permanent injury and may be a risk to your life. SEEK MEDICAL CARE IF:  You have wheezing and shortness of breath even if taking medicine to prevent attacks.  You have muscle aches, chest pain, or thickening of sputum.  Your sputum changes from clear or white to yellow, green, gray, or bloody. SEEK IMMEDIATE MEDICAL CARE IF:  Your usual medicines do not stop your wheezing.  You have increased coughing or shortness of breath or both.  You have increased difficulty breathing.  You have any problems from the medicine you are taking, such as a rash, itching, swelling, or trouble breathing. MAKE SURE YOU:   Understand these instructions.  Will watch your condition.  Will get help right away if you are not doing well or get worse. Document Released: 08/22/2006 Document Revised: 03/21/2014 Document Reviewed: 12/13/2013 Exit[employer] Patient Information 2015 [employer]. This information is not intended to replace advice given to you by your health care provider. Make sure you discuss any questions you have with your health care provider.  

## 2014-10-31 ENCOUNTER — Telehealth: Payer: Self-pay

## 2014-10-31 NOTE — Telephone Encounter (Signed)
LMVM for pt to come in to have his flu shot.

## 2015-02-25 ENCOUNTER — Other Ambulatory Visit: Payer: Self-pay | Admitting: Emergency Medicine

## 2015-04-04 ENCOUNTER — Other Ambulatory Visit: Payer: Self-pay | Admitting: Physician Assistant

## 2015-08-09 ENCOUNTER — Other Ambulatory Visit: Payer: Self-pay | Admitting: Emergency Medicine

## 2015-08-10 NOTE — Telephone Encounter (Signed)
LMOM for pt that he is overdue for f/up and asked him to come in or CB w/plan.

## 2015-08-11 NOTE — Telephone Encounter (Signed)
Pharm called to check status. Advised waiting for pt to CB w/plan for f/up, or for him to just come back for OV. Advised we can not RF until pt seen.

## 2015-10-16 ENCOUNTER — Encounter: Payer: Self-pay | Admitting: Internal Medicine

## 2017-08-20 DIAGNOSIS — M5137 Other intervertebral disc degeneration, lumbosacral region: Secondary | ICD-10-CM | POA: Diagnosis not present

## 2017-08-20 DIAGNOSIS — M5442 Lumbago with sciatica, left side: Secondary | ICD-10-CM | POA: Diagnosis not present

## 2017-08-20 DIAGNOSIS — M9905 Segmental and somatic dysfunction of pelvic region: Secondary | ICD-10-CM | POA: Diagnosis not present

## 2017-08-20 DIAGNOSIS — M9901 Segmental and somatic dysfunction of cervical region: Secondary | ICD-10-CM | POA: Diagnosis not present

## 2017-08-20 DIAGNOSIS — M5032 Other cervical disc degeneration, mid-cervical region, unspecified level: Secondary | ICD-10-CM | POA: Diagnosis not present

## 2017-08-20 DIAGNOSIS — M9902 Segmental and somatic dysfunction of thoracic region: Secondary | ICD-10-CM | POA: Diagnosis not present

## 2017-08-20 DIAGNOSIS — M5441 Lumbago with sciatica, right side: Secondary | ICD-10-CM | POA: Diagnosis not present

## 2017-08-20 DIAGNOSIS — M25531 Pain in right wrist: Secondary | ICD-10-CM | POA: Diagnosis not present

## 2017-08-20 DIAGNOSIS — M9907 Segmental and somatic dysfunction of upper extremity: Secondary | ICD-10-CM | POA: Diagnosis not present

## 2017-08-20 DIAGNOSIS — M62830 Muscle spasm of back: Secondary | ICD-10-CM | POA: Diagnosis not present

## 2017-08-20 DIAGNOSIS — M9903 Segmental and somatic dysfunction of lumbar region: Secondary | ICD-10-CM | POA: Diagnosis not present

## 2017-09-10 DIAGNOSIS — M9901 Segmental and somatic dysfunction of cervical region: Secondary | ICD-10-CM | POA: Diagnosis not present

## 2017-09-10 DIAGNOSIS — M9902 Segmental and somatic dysfunction of thoracic region: Secondary | ICD-10-CM | POA: Diagnosis not present

## 2017-09-10 DIAGNOSIS — M9903 Segmental and somatic dysfunction of lumbar region: Secondary | ICD-10-CM | POA: Diagnosis not present

## 2017-09-10 DIAGNOSIS — M5137 Other intervertebral disc degeneration, lumbosacral region: Secondary | ICD-10-CM | POA: Diagnosis not present

## 2017-09-10 DIAGNOSIS — M5414 Radiculopathy, thoracic region: Secondary | ICD-10-CM | POA: Diagnosis not present

## 2017-09-10 DIAGNOSIS — M5032 Other cervical disc degeneration, mid-cervical region, unspecified level: Secondary | ICD-10-CM | POA: Diagnosis not present

## 2017-10-01 DIAGNOSIS — M9901 Segmental and somatic dysfunction of cervical region: Secondary | ICD-10-CM | POA: Diagnosis not present

## 2017-10-01 DIAGNOSIS — M5137 Other intervertebral disc degeneration, lumbosacral region: Secondary | ICD-10-CM | POA: Diagnosis not present

## 2017-10-01 DIAGNOSIS — M5414 Radiculopathy, thoracic region: Secondary | ICD-10-CM | POA: Diagnosis not present

## 2017-10-01 DIAGNOSIS — M9903 Segmental and somatic dysfunction of lumbar region: Secondary | ICD-10-CM | POA: Diagnosis not present

## 2017-10-01 DIAGNOSIS — M5032 Other cervical disc degeneration, mid-cervical region, unspecified level: Secondary | ICD-10-CM | POA: Diagnosis not present

## 2017-10-01 DIAGNOSIS — M9902 Segmental and somatic dysfunction of thoracic region: Secondary | ICD-10-CM | POA: Diagnosis not present

## 2017-10-22 DIAGNOSIS — M9902 Segmental and somatic dysfunction of thoracic region: Secondary | ICD-10-CM | POA: Diagnosis not present

## 2017-10-22 DIAGNOSIS — M5032 Other cervical disc degeneration, mid-cervical region, unspecified level: Secondary | ICD-10-CM | POA: Diagnosis not present

## 2017-10-22 DIAGNOSIS — M5137 Other intervertebral disc degeneration, lumbosacral region: Secondary | ICD-10-CM | POA: Diagnosis not present

## 2017-10-22 DIAGNOSIS — M9901 Segmental and somatic dysfunction of cervical region: Secondary | ICD-10-CM | POA: Diagnosis not present

## 2017-10-22 DIAGNOSIS — M5414 Radiculopathy, thoracic region: Secondary | ICD-10-CM | POA: Diagnosis not present

## 2017-10-22 DIAGNOSIS — M9903 Segmental and somatic dysfunction of lumbar region: Secondary | ICD-10-CM | POA: Diagnosis not present

## 2018-11-24 DIAGNOSIS — M545 Low back pain: Secondary | ICD-10-CM | POA: Diagnosis not present

## 2018-12-08 DIAGNOSIS — M545 Low back pain: Secondary | ICD-10-CM | POA: Diagnosis not present

## 2018-12-08 DIAGNOSIS — M542 Cervicalgia: Secondary | ICD-10-CM | POA: Diagnosis not present

## 2018-12-15 DIAGNOSIS — M542 Cervicalgia: Secondary | ICD-10-CM | POA: Diagnosis not present

## 2018-12-15 DIAGNOSIS — M5416 Radiculopathy, lumbar region: Secondary | ICD-10-CM | POA: Diagnosis not present

## 2018-12-15 DIAGNOSIS — M545 Low back pain: Secondary | ICD-10-CM | POA: Diagnosis not present

## 2018-12-29 DIAGNOSIS — R030 Elevated blood-pressure reading, without diagnosis of hypertension: Secondary | ICD-10-CM | POA: Diagnosis not present

## 2018-12-29 DIAGNOSIS — G5601 Carpal tunnel syndrome, right upper limb: Secondary | ICD-10-CM | POA: Diagnosis not present

## 2018-12-29 DIAGNOSIS — M5416 Radiculopathy, lumbar region: Secondary | ICD-10-CM | POA: Diagnosis not present

## 2018-12-29 DIAGNOSIS — M4802 Spinal stenosis, cervical region: Secondary | ICD-10-CM | POA: Diagnosis not present

## 2018-12-29 DIAGNOSIS — Z6829 Body mass index (BMI) 29.0-29.9, adult: Secondary | ICD-10-CM | POA: Diagnosis not present

## 2018-12-29 DIAGNOSIS — M48062 Spinal stenosis, lumbar region with neurogenic claudication: Secondary | ICD-10-CM | POA: Diagnosis not present

## 2019-01-06 DIAGNOSIS — M5416 Radiculopathy, lumbar region: Secondary | ICD-10-CM | POA: Diagnosis not present

## 2019-01-06 DIAGNOSIS — M48062 Spinal stenosis, lumbar region with neurogenic claudication: Secondary | ICD-10-CM | POA: Diagnosis not present

## 2019-01-07 DIAGNOSIS — G5601 Carpal tunnel syndrome, right upper limb: Secondary | ICD-10-CM | POA: Diagnosis not present

## 2019-01-07 DIAGNOSIS — M4802 Spinal stenosis, cervical region: Secondary | ICD-10-CM | POA: Diagnosis not present

## 2019-01-13 DIAGNOSIS — M5412 Radiculopathy, cervical region: Secondary | ICD-10-CM | POA: Diagnosis not present

## 2019-01-13 DIAGNOSIS — M4802 Spinal stenosis, cervical region: Secondary | ICD-10-CM | POA: Diagnosis not present

## 2019-02-03 DIAGNOSIS — M48062 Spinal stenosis, lumbar region with neurogenic claudication: Secondary | ICD-10-CM | POA: Diagnosis not present

## 2019-02-03 DIAGNOSIS — M4802 Spinal stenosis, cervical region: Secondary | ICD-10-CM | POA: Diagnosis not present

## 2019-03-12 DIAGNOSIS — M48062 Spinal stenosis, lumbar region with neurogenic claudication: Secondary | ICD-10-CM | POA: Diagnosis not present

## 2019-03-12 DIAGNOSIS — M5416 Radiculopathy, lumbar region: Secondary | ICD-10-CM | POA: Diagnosis not present

## 2019-04-09 DIAGNOSIS — M5412 Radiculopathy, cervical region: Secondary | ICD-10-CM | POA: Diagnosis not present

## 2019-04-09 DIAGNOSIS — M4802 Spinal stenosis, cervical region: Secondary | ICD-10-CM | POA: Diagnosis not present

## 2019-04-27 DIAGNOSIS — M48062 Spinal stenosis, lumbar region with neurogenic claudication: Secondary | ICD-10-CM | POA: Diagnosis not present

## 2019-04-27 DIAGNOSIS — M25552 Pain in left hip: Secondary | ICD-10-CM | POA: Diagnosis not present

## 2019-04-27 DIAGNOSIS — M7062 Trochanteric bursitis, left hip: Secondary | ICD-10-CM | POA: Diagnosis not present

## 2019-04-27 DIAGNOSIS — M5416 Radiculopathy, lumbar region: Secondary | ICD-10-CM | POA: Diagnosis not present

## 2019-04-27 DIAGNOSIS — M5412 Radiculopathy, cervical region: Secondary | ICD-10-CM | POA: Diagnosis not present

## 2019-04-27 DIAGNOSIS — M4802 Spinal stenosis, cervical region: Secondary | ICD-10-CM | POA: Diagnosis not present

## 2019-05-12 DIAGNOSIS — M1612 Unilateral primary osteoarthritis, left hip: Secondary | ICD-10-CM | POA: Diagnosis not present

## 2019-07-09 DIAGNOSIS — G5602 Carpal tunnel syndrome, left upper limb: Secondary | ICD-10-CM | POA: Diagnosis not present

## 2019-07-13 DIAGNOSIS — M25532 Pain in left wrist: Secondary | ICD-10-CM | POA: Diagnosis not present

## 2019-07-27 DIAGNOSIS — S6982XA Other specified injuries of left wrist, hand and finger(s), initial encounter: Secondary | ICD-10-CM | POA: Diagnosis not present

## 2019-07-27 DIAGNOSIS — M79642 Pain in left hand: Secondary | ICD-10-CM | POA: Diagnosis not present

## 2019-07-27 DIAGNOSIS — M25532 Pain in left wrist: Secondary | ICD-10-CM | POA: Diagnosis not present

## 2019-08-03 DIAGNOSIS — M25532 Pain in left wrist: Secondary | ICD-10-CM | POA: Diagnosis not present

## 2019-08-12 DIAGNOSIS — S6982XA Other specified injuries of left wrist, hand and finger(s), initial encounter: Secondary | ICD-10-CM | POA: Diagnosis not present

## 2019-08-19 HISTORY — PX: THUMB ARTHROSCOPY: SHX2509

## 2019-08-24 DIAGNOSIS — R2232 Localized swelling, mass and lump, left upper limb: Secondary | ICD-10-CM | POA: Diagnosis not present

## 2019-08-25 ENCOUNTER — Other Ambulatory Visit: Payer: Self-pay

## 2019-08-25 DIAGNOSIS — L989 Disorder of the skin and subcutaneous tissue, unspecified: Secondary | ICD-10-CM | POA: Diagnosis not present

## 2019-08-25 DIAGNOSIS — R2232 Localized swelling, mass and lump, left upper limb: Secondary | ICD-10-CM | POA: Diagnosis not present

## 2019-08-25 DIAGNOSIS — D1612 Benign neoplasm of short bones of left upper limb: Secondary | ICD-10-CM | POA: Diagnosis not present

## 2019-08-26 DIAGNOSIS — Z23 Encounter for immunization: Secondary | ICD-10-CM | POA: Diagnosis not present

## 2019-09-01 DIAGNOSIS — M5416 Radiculopathy, lumbar region: Secondary | ICD-10-CM | POA: Diagnosis not present

## 2019-09-01 DIAGNOSIS — M48062 Spinal stenosis, lumbar region with neurogenic claudication: Secondary | ICD-10-CM | POA: Diagnosis not present

## 2019-09-21 DIAGNOSIS — M1612 Unilateral primary osteoarthritis, left hip: Secondary | ICD-10-CM | POA: Diagnosis not present

## 2019-10-18 DIAGNOSIS — M9902 Segmental and somatic dysfunction of thoracic region: Secondary | ICD-10-CM | POA: Diagnosis not present

## 2019-10-18 DIAGNOSIS — M9903 Segmental and somatic dysfunction of lumbar region: Secondary | ICD-10-CM | POA: Diagnosis not present

## 2019-10-18 DIAGNOSIS — M9901 Segmental and somatic dysfunction of cervical region: Secondary | ICD-10-CM | POA: Diagnosis not present

## 2019-10-20 DIAGNOSIS — M9903 Segmental and somatic dysfunction of lumbar region: Secondary | ICD-10-CM | POA: Diagnosis not present

## 2019-10-20 DIAGNOSIS — M9902 Segmental and somatic dysfunction of thoracic region: Secondary | ICD-10-CM | POA: Diagnosis not present

## 2019-10-20 DIAGNOSIS — M9901 Segmental and somatic dysfunction of cervical region: Secondary | ICD-10-CM | POA: Diagnosis not present

## 2019-10-22 DIAGNOSIS — M9902 Segmental and somatic dysfunction of thoracic region: Secondary | ICD-10-CM | POA: Diagnosis not present

## 2019-10-22 DIAGNOSIS — M9903 Segmental and somatic dysfunction of lumbar region: Secondary | ICD-10-CM | POA: Diagnosis not present

## 2019-10-22 DIAGNOSIS — M9901 Segmental and somatic dysfunction of cervical region: Secondary | ICD-10-CM | POA: Diagnosis not present

## 2019-10-25 DIAGNOSIS — M9902 Segmental and somatic dysfunction of thoracic region: Secondary | ICD-10-CM | POA: Diagnosis not present

## 2019-10-25 DIAGNOSIS — M9901 Segmental and somatic dysfunction of cervical region: Secondary | ICD-10-CM | POA: Diagnosis not present

## 2019-10-25 DIAGNOSIS — M9903 Segmental and somatic dysfunction of lumbar region: Secondary | ICD-10-CM | POA: Diagnosis not present

## 2019-10-28 DIAGNOSIS — M9903 Segmental and somatic dysfunction of lumbar region: Secondary | ICD-10-CM | POA: Diagnosis not present

## 2019-10-28 DIAGNOSIS — M9902 Segmental and somatic dysfunction of thoracic region: Secondary | ICD-10-CM | POA: Diagnosis not present

## 2019-10-28 DIAGNOSIS — M9901 Segmental and somatic dysfunction of cervical region: Secondary | ICD-10-CM | POA: Diagnosis not present

## 2019-11-01 DIAGNOSIS — M9903 Segmental and somatic dysfunction of lumbar region: Secondary | ICD-10-CM | POA: Diagnosis not present

## 2019-11-01 DIAGNOSIS — M9901 Segmental and somatic dysfunction of cervical region: Secondary | ICD-10-CM | POA: Diagnosis not present

## 2019-11-01 DIAGNOSIS — M9902 Segmental and somatic dysfunction of thoracic region: Secondary | ICD-10-CM | POA: Diagnosis not present

## 2019-11-05 DIAGNOSIS — M9902 Segmental and somatic dysfunction of thoracic region: Secondary | ICD-10-CM | POA: Diagnosis not present

## 2019-11-05 DIAGNOSIS — M9903 Segmental and somatic dysfunction of lumbar region: Secondary | ICD-10-CM | POA: Diagnosis not present

## 2019-11-05 DIAGNOSIS — M9901 Segmental and somatic dysfunction of cervical region: Secondary | ICD-10-CM | POA: Diagnosis not present

## 2019-11-08 DIAGNOSIS — M9902 Segmental and somatic dysfunction of thoracic region: Secondary | ICD-10-CM | POA: Diagnosis not present

## 2019-11-08 DIAGNOSIS — M9901 Segmental and somatic dysfunction of cervical region: Secondary | ICD-10-CM | POA: Diagnosis not present

## 2019-11-08 DIAGNOSIS — M9903 Segmental and somatic dysfunction of lumbar region: Secondary | ICD-10-CM | POA: Diagnosis not present

## 2019-11-11 DIAGNOSIS — M9902 Segmental and somatic dysfunction of thoracic region: Secondary | ICD-10-CM | POA: Diagnosis not present

## 2019-11-11 DIAGNOSIS — M9901 Segmental and somatic dysfunction of cervical region: Secondary | ICD-10-CM | POA: Diagnosis not present

## 2019-11-11 DIAGNOSIS — M9903 Segmental and somatic dysfunction of lumbar region: Secondary | ICD-10-CM | POA: Diagnosis not present

## 2019-11-15 DIAGNOSIS — M9901 Segmental and somatic dysfunction of cervical region: Secondary | ICD-10-CM | POA: Diagnosis not present

## 2019-11-15 DIAGNOSIS — M9903 Segmental and somatic dysfunction of lumbar region: Secondary | ICD-10-CM | POA: Diagnosis not present

## 2019-11-15 DIAGNOSIS — M9902 Segmental and somatic dysfunction of thoracic region: Secondary | ICD-10-CM | POA: Diagnosis not present

## 2019-11-18 DIAGNOSIS — M9902 Segmental and somatic dysfunction of thoracic region: Secondary | ICD-10-CM | POA: Diagnosis not present

## 2019-11-18 DIAGNOSIS — M9903 Segmental and somatic dysfunction of lumbar region: Secondary | ICD-10-CM | POA: Diagnosis not present

## 2019-11-18 DIAGNOSIS — M9901 Segmental and somatic dysfunction of cervical region: Secondary | ICD-10-CM | POA: Diagnosis not present

## 2019-11-22 DIAGNOSIS — M9902 Segmental and somatic dysfunction of thoracic region: Secondary | ICD-10-CM | POA: Diagnosis not present

## 2019-11-22 DIAGNOSIS — M9903 Segmental and somatic dysfunction of lumbar region: Secondary | ICD-10-CM | POA: Diagnosis not present

## 2019-11-22 DIAGNOSIS — M9904 Segmental and somatic dysfunction of sacral region: Secondary | ICD-10-CM | POA: Diagnosis not present

## 2019-11-22 DIAGNOSIS — M9901 Segmental and somatic dysfunction of cervical region: Secondary | ICD-10-CM | POA: Diagnosis not present

## 2019-11-29 DIAGNOSIS — M9904 Segmental and somatic dysfunction of sacral region: Secondary | ICD-10-CM | POA: Diagnosis not present

## 2019-11-29 DIAGNOSIS — M9901 Segmental and somatic dysfunction of cervical region: Secondary | ICD-10-CM | POA: Diagnosis not present

## 2019-11-29 DIAGNOSIS — M9903 Segmental and somatic dysfunction of lumbar region: Secondary | ICD-10-CM | POA: Diagnosis not present

## 2019-11-29 DIAGNOSIS — M9902 Segmental and somatic dysfunction of thoracic region: Secondary | ICD-10-CM | POA: Diagnosis not present

## 2020-01-18 DIAGNOSIS — M48062 Spinal stenosis, lumbar region with neurogenic claudication: Secondary | ICD-10-CM | POA: Diagnosis not present

## 2020-03-31 DIAGNOSIS — M461 Sacroiliitis, not elsewhere classified: Secondary | ICD-10-CM | POA: Diagnosis not present

## 2020-06-01 DIAGNOSIS — G629 Polyneuropathy, unspecified: Secondary | ICD-10-CM | POA: Diagnosis not present

## 2020-06-01 DIAGNOSIS — R238 Other skin changes: Secondary | ICD-10-CM | POA: Diagnosis not present

## 2020-06-01 DIAGNOSIS — W57XXXA Bitten or stung by nonvenomous insect and other nonvenomous arthropods, initial encounter: Secondary | ICD-10-CM | POA: Diagnosis not present

## 2020-06-01 DIAGNOSIS — F17290 Nicotine dependence, other tobacco product, uncomplicated: Secondary | ICD-10-CM | POA: Diagnosis not present

## 2020-06-01 DIAGNOSIS — S30861A Insect bite (nonvenomous) of abdominal wall, initial encounter: Secondary | ICD-10-CM | POA: Diagnosis not present

## 2020-06-01 DIAGNOSIS — I10 Essential (primary) hypertension: Secondary | ICD-10-CM | POA: Diagnosis not present

## 2020-07-03 DIAGNOSIS — R21 Rash and other nonspecific skin eruption: Secondary | ICD-10-CM | POA: Diagnosis not present

## 2020-07-03 DIAGNOSIS — I10 Essential (primary) hypertension: Secondary | ICD-10-CM | POA: Diagnosis not present

## 2020-07-11 DIAGNOSIS — M461 Sacroiliitis, not elsewhere classified: Secondary | ICD-10-CM | POA: Diagnosis not present

## 2020-08-03 DIAGNOSIS — I10 Essential (primary) hypertension: Secondary | ICD-10-CM | POA: Diagnosis not present

## 2020-08-03 DIAGNOSIS — R21 Rash and other nonspecific skin eruption: Secondary | ICD-10-CM | POA: Diagnosis not present

## 2020-08-03 DIAGNOSIS — R238 Other skin changes: Secondary | ICD-10-CM | POA: Diagnosis not present

## 2020-08-03 DIAGNOSIS — E538 Deficiency of other specified B group vitamins: Secondary | ICD-10-CM | POA: Diagnosis not present

## 2020-08-03 DIAGNOSIS — G629 Polyneuropathy, unspecified: Secondary | ICD-10-CM | POA: Diagnosis not present

## 2020-08-23 ENCOUNTER — Encounter: Payer: Self-pay | Admitting: Neurology

## 2020-09-04 DIAGNOSIS — G629 Polyneuropathy, unspecified: Secondary | ICD-10-CM | POA: Diagnosis not present

## 2020-09-04 DIAGNOSIS — R238 Other skin changes: Secondary | ICD-10-CM | POA: Diagnosis not present

## 2020-09-04 DIAGNOSIS — I10 Essential (primary) hypertension: Secondary | ICD-10-CM | POA: Diagnosis not present

## 2020-09-08 DIAGNOSIS — M5412 Radiculopathy, cervical region: Secondary | ICD-10-CM | POA: Diagnosis not present

## 2020-09-08 DIAGNOSIS — I10 Essential (primary) hypertension: Secondary | ICD-10-CM | POA: Diagnosis not present

## 2020-09-08 DIAGNOSIS — Z6828 Body mass index (BMI) 28.0-28.9, adult: Secondary | ICD-10-CM | POA: Diagnosis not present

## 2020-10-31 DIAGNOSIS — Z23 Encounter for immunization: Secondary | ICD-10-CM | POA: Diagnosis not present

## 2020-11-27 ENCOUNTER — Ambulatory Visit (INDEPENDENT_AMBULATORY_CARE_PROVIDER_SITE_OTHER): Payer: Medicare Other | Admitting: Neurology

## 2020-11-27 ENCOUNTER — Encounter: Payer: Self-pay | Admitting: Neurology

## 2020-11-27 ENCOUNTER — Other Ambulatory Visit (INDEPENDENT_AMBULATORY_CARE_PROVIDER_SITE_OTHER): Payer: Medicare Other

## 2020-11-27 ENCOUNTER — Other Ambulatory Visit: Payer: Self-pay

## 2020-11-27 VITALS — BP 150/87 | HR 81 | Ht 67.0 in | Wt 174.0 lb

## 2020-11-27 DIAGNOSIS — E538 Deficiency of other specified B group vitamins: Secondary | ICD-10-CM | POA: Diagnosis not present

## 2020-11-27 DIAGNOSIS — E618 Deficiency of other specified nutrient elements: Secondary | ICD-10-CM

## 2020-11-27 DIAGNOSIS — G621 Alcoholic polyneuropathy: Secondary | ICD-10-CM | POA: Diagnosis not present

## 2020-11-27 LAB — FOLATE: Folate: >23.6 ng/mL (ref >5.9)

## 2020-11-27 NOTE — Patient Instructions (Addendum)
1. Check labs.  We will call you with the results Your provider has requested that you have labwork completed today. Please go to [facility] ([id number]) on the second floor of this building before leaving the office today. You do not need to check in. If you are not called within 15 minutes please check with the front desk.  2. Check your feet daily 3. Use shower chair/grab bars in the bathroom 4. Start using a cane to help with balance

## 2020-11-27 NOTE — Progress Notes (Signed)
[facility] Clinic Note - Initial Visit   Date: 11/27/20  Rodney Frey MRN: [id number] DOB: 07-17-1951   Dear Benjiman Core, PA:  Thank you for your kind referral of Rodney Frey for consultation of bilateral feet numbness. Although his history is well known to you, please allow [place] to reiterate it for the purpose of our medical record. The patient was accompanied to the clinic by self.   History of Present Illness: Rodney Frey is a 70 y.o. male with hypertension, tobacco use, OA, lumbosacral spondylosis, and alcohol use presenting for evaluation of bilateral feet numbness. For the past 10-12 years, he has constant numbness and burning of the soles of the feet and toes. He has sensation of padding at the bottoms of the toes.  Denies weakness.  He does have imbalance, walks unassisted. No exacerbating or aggravating factors.  He also has intermittent sharp pain and numbness over the left lateral leg, which is triggered by prolonged standing/walking and alleviated with rest. Prior ABI from 2013 was normal.  He has been seeing Dr. Brien Few for low back pain and received ESI to the lumbar region and left sacroiliac region which helps with pain relief.  MRI lumbar spine (11/2018) shows severe degenerative disease at L4-5 with multifocal moderate spinal and bilateral lateral recess stenosis and mild to moderate biforaminal stenosis.  MRI cervical spine shows mild multifactorial spinal stenosis at C6-7.  NCS/EMG of bilateral hands shows severe bilateral CTS.    He is not diabetic. Upon further questioning, he has long history of alcohol abuse which started when he was in the [place].  He drank liquor heavily in the 80s-90s, then started with beer throughout 1990s - 2010s.  For the past several years, he has been sober.    Past Medical History:  Diagnosis Date  . Carpal tunnel syndrome, bilateral   . Lung mass     Past Surgical History:  Procedure Laterality Date   . LUNG SURGERY  2013  . THUMB ARTHROSCOPY  08/2019     Medications:  Outpatient Encounter Medications as of 11/27/2020  Medication Sig  . Ascorbic Acid (VITAMIN C) 1000 MG tablet Take 1,000 mg by mouth daily.  . cholecalciferol (VITAMIN D3) 25 MCG (1000 UNIT) tablet Take 1,000 Units by mouth daily.  . Cyanocobalamin (VITAMIN B-12) 5000 MCG TBDP Take 1 tablet by mouth daily.  . hydrochlorothiazide (HYDRODIURIL) 25 MG tablet Take 25 mg by mouth daily.  . [DISCONTINUED] albuterol (PROVENTIL HFA;VENTOLIN HFA) 108 (90 BASE) MCG/ACT inhaler Inhale 2 puffs into the lungs every 4 (four) hours as needed for wheezing or shortness of breath. (Patient not taking: Reported on 11/27/2020)  . [DISCONTINUED] amoxicillin-clavulanate (AUGMENTIN) 875-125 MG per tablet Take 1 tablet by mouth 2 (two) times daily. (Patient not taking: Reported on 11/27/2020)  . [DISCONTINUED] HYDROcodone-homatropine (HYCODAN) 5-1.5 MG/5ML syrup Take 5 mLs by mouth every 6 (six) hours as needed for cough. (Patient not taking: Reported on 11/27/2020)  . [DISCONTINUED] meloxicam (MOBIC) 15 MG tablet TAKE 1 TABLET (15 MG TOTAL) BY MOUTH DAILY.  "OV NEEDED FOR ADDITIONAL REFILLS" 2ND (Patient not taking: Reported on 11/27/2020)  . [DISCONTINUED] predniSONE (DELTASONE) 10 MG tablet Take 1 tablet (10 mg total) by mouth daily with breakfast. (Patient not taking: Reported on 11/27/2020)  . [DISCONTINUED] promethazine-codeine (PHENERGAN WITH CODEINE) 6.25-10 MG/5ML syrup Take 5-10 mLs by mouth every 6 (six) hours as needed. (Patient not taking: Reported on 11/27/2020)   No facility-administered encounter medications on file as of  11/27/2020.    Allergies:  Allergies  Allergen Reactions  . Hydrocodone     Intestinal problems     Family History: Family History  Problem Relation Age of Onset  . High blood pressure Mother   . Stroke Brother     Social History: Social History   Tobacco Use  . Smoking status: Former Smoker     Packs/day: 1.00    Years: 40.00    Pack years: 40.00    Types: Cigarettes  . Smokeless tobacco: Never Used  Vaping Use  . Vaping Use: Never used  Substance Use Topics  . Alcohol use: Yes  . Drug use: No   Social History   Social History Narrative  . Not on file    Vital Signs:  BP (!) 150/87   Pulse 81   Ht 5\' 7"  (1.702 m)   Wt 174 lb (78.9 kg)   SpO2 94%   BMI 27.25 kg/m   Neurological Exam: MENTAL STATUS including orientation to time, place, person, recent and remote memory, attention span and concentration, language, and fund of knowledge is normal.  Speech is not dysarthric.  CRANIAL NERVES: II:  No visual field defects.   III-IV-VI: Pupils equal round and reactive to light.  Normal conjugate, extra-ocular eye movements in all directions of gaze.  No nystagmus.  No ptosis.   V:  Normal facial sensation.    VII:  Normal facial symmetry and movements.   VIII:  Normal hearing and vestibular function.   IX-X:  Normal palatal movement.   XI:  Normal shoulder shrug and head rotation.   XII:  Normal tongue strength and range of motion, no deviation or fasciculation.  MOTOR:  Bilateral ABP atrophy, No fasciculations or abnormal movements.  No pronator drift.   Upper Extremity:  Right  Left  Deltoid  5/5   5/5   Biceps  5/5   5/5   Triceps  5/5   5/5   Infraspinatus 5/5  5/5  Medial pectoralis 5/5  5/5  Wrist extensors  5/5   5/5   Wrist flexors  5/5   5/5   Finger extensors  5/5   5/5   Finger flexors  5/5   5/5   Dorsal interossei  5/5   5/5   Abductor pollicis  4/5   4/5   Tone (Ashworth scale)  0  0   Lower Extremity:  Right  Left  Hip flexors  5/5   5/5   Hip extensors  5/5   5/5   Adductor 5/5  5/5  Abductor 5/5  5/5  Knee flexors  5/5   5/5   Knee extensors  5/5   5/5   Dorsiflexors  5/5   5/5   Plantarflexors  5/5   5/5   Toe extensors  5/5   5/5   Toe flexors  5/5   5/5   Tone (Ashworth scale)  0  0   MSRs:  Right        Left                   brachioradialis 2+  2+  biceps 2+  2+  triceps 2+  2+  patellar 3+  3+  ankle jerk 2+  2+  Hoffman no  no  plantar response down  down   SENSORY:  Vibration is absent distal to ankles bilaterally, temperature and pin prick reduced in the feet, worse on the left.  Rhomberg sign is positive.  COORDINATION/GAIT: Normal finger-to- nose-finger.  Intact rapid alternating movements bilaterally.  Gait wide-based, antalgic, unassisted.     IMPRESSION: 1. 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   I had extensive discussion with the patient regarding the pathogenesis, etiology, management, and natural course of neuropathy. Neuropathy tends to be slowly progressive, especially if a treatable etiology is not identified.  I would like to test for treatable causes of neuropathy. I discussed that his chronic alcohol use resulted in neurotoxicity to the nerves and unfortunately, this cannot be reversed and deficits will be lasting.    2. Left lower leg paresthesias due to L5 radiculopathy and spinal canal stenosis, followed by Dr. Brien Few    PLAN/RECOMMENDATIONS:  Vitamin B1, folate, copper, SEPEP WITH IFE NCS/EMG of the legs, if symptoms progress in an atypical manner Patient educated on daily foot inspection, fall prevention, and safety precautions around the home.  Return to clinic as needed  Total time spent reviewing records, interview, history/exam, counseling, documentation, and coordination of care on day of encounter:  60 min    Thank you for allowing me to participate in patient's care.  If I can answer any additional questions, I would be pleased to do so.    Sincerely,    Nickisha Hum K. Posey Pronto, DO

## 2020-11-30 LAB — IMMUNOFIXATION, SERUM
IgA/Immunoglobulin A, Serum: 118 mg/dL (ref 61–437)
IgG (Immunoglobin G), Serum: 537 mg/dL — ABNORMAL LOW (ref 603–1613)
IgM (Immunoglobulin M), Srm: 55 mg/dL (ref 20–172)

## 2020-11-30 LAB — SPECIMEN STATUS REPORT

## 2020-12-01 LAB — PROTEIN ELECTROPHORESIS, SERUM
Albumin ELP: 3.9 g/dL (ref 3.8–4.8)
Alpha 1: 0.3 g/dL (ref 0.2–0.3)
Alpha 2: 0.8 g/dL (ref 0.5–0.9)
Beta 2: 0.3 g/dL (ref 0.2–0.5)
Beta Globulin: 0.4 g/dL (ref 0.4–0.6)
Gamma Globulin: 0.5 g/dL — ABNORMAL LOW (ref 0.8–1.7)
Total Protein: 6.3 g/dL (ref 6.1–8.1)

## 2020-12-01 LAB — COPPER, SERUM: Copper: 104 ug/dL (ref 70–175)

## 2020-12-01 LAB — VITAMIN B1: Vitamin B1 (Thiamine): 35 nmol/L — ABNORMAL HIGH (ref 8–30)

## 2020-12-11 ENCOUNTER — Telehealth: Payer: Self-pay

## 2020-12-11 DIAGNOSIS — R7309 Other abnormal glucose: Secondary | ICD-10-CM | POA: Diagnosis not present

## 2020-12-11 NOTE — Telephone Encounter (Signed)
-----   Message from Alda Berthold, DO sent at 12/05/2020  2:51 PM EST ----- Please notify patient labs looking at other causes of neuropathy are within normal limits.  Thank you.

## 2020-12-11 NOTE — Telephone Encounter (Signed)
Called patient and informed him of results. Patient verbalized understanding. 

## 2021-01-31 DIAGNOSIS — M461 Sacroiliitis, not elsewhere classified: Secondary | ICD-10-CM | POA: Diagnosis not present

## 2021-01-31 DIAGNOSIS — R030 Elevated blood-pressure reading, without diagnosis of hypertension: Secondary | ICD-10-CM | POA: Diagnosis not present

## 2021-01-31 DIAGNOSIS — Z6826 Body mass index (BMI) 26.0-26.9, adult: Secondary | ICD-10-CM | POA: Diagnosis not present

## 2021-03-09 DIAGNOSIS — I10 Essential (primary) hypertension: Secondary | ICD-10-CM | POA: Diagnosis not present

## 2022-05-02 DIAGNOSIS — R7303 Prediabetes: Secondary | ICD-10-CM | POA: Diagnosis not present

## 2022-05-02 DIAGNOSIS — Z136 Encounter for screening for cardiovascular disorders: Secondary | ICD-10-CM | POA: Diagnosis not present

## 2022-05-02 DIAGNOSIS — Z125 Encounter for screening for malignant neoplasm of prostate: Secondary | ICD-10-CM | POA: Diagnosis not present

## 2022-05-17 DIAGNOSIS — G8929 Other chronic pain: Secondary | ICD-10-CM | POA: Diagnosis not present

## 2022-05-17 DIAGNOSIS — I10 Essential (primary) hypertension: Secondary | ICD-10-CM | POA: Diagnosis not present

## 2022-05-17 DIAGNOSIS — E782 Mixed hyperlipidemia: Secondary | ICD-10-CM | POA: Diagnosis not present

## 2022-08-03 DIAGNOSIS — Z23 Encounter for immunization: Secondary | ICD-10-CM | POA: Diagnosis not present

## 2022-08-15 DIAGNOSIS — E785 Hyperlipidemia, unspecified: Secondary | ICD-10-CM | POA: Diagnosis not present

## 2022-10-29 DIAGNOSIS — R7303 Prediabetes: Secondary | ICD-10-CM | POA: Diagnosis not present

## 2022-10-29 DIAGNOSIS — E785 Hyperlipidemia, unspecified: Secondary | ICD-10-CM | POA: Diagnosis not present

## 2022-11-14 DIAGNOSIS — M9904 Segmental and somatic dysfunction of sacral region: Secondary | ICD-10-CM | POA: Diagnosis not present

## 2022-11-14 DIAGNOSIS — M5386 Other specified dorsopathies, lumbar region: Secondary | ICD-10-CM | POA: Diagnosis not present

## 2022-11-14 DIAGNOSIS — M9905 Segmental and somatic dysfunction of pelvic region: Secondary | ICD-10-CM | POA: Diagnosis not present

## 2022-11-14 DIAGNOSIS — M5137 Other intervertebral disc degeneration, lumbosacral region: Secondary | ICD-10-CM | POA: Diagnosis not present

## 2022-11-14 DIAGNOSIS — M9903 Segmental and somatic dysfunction of lumbar region: Secondary | ICD-10-CM | POA: Diagnosis not present

## 2022-11-14 DIAGNOSIS — M9901 Segmental and somatic dysfunction of cervical region: Secondary | ICD-10-CM | POA: Diagnosis not present

## 2022-11-14 DIAGNOSIS — M5432 Sciatica, left side: Secondary | ICD-10-CM | POA: Diagnosis not present

## 2022-11-14 DIAGNOSIS — M5032 Other cervical disc degeneration, mid-cervical region, unspecified level: Secondary | ICD-10-CM | POA: Diagnosis not present

## 2022-11-20 DIAGNOSIS — M5432 Sciatica, left side: Secondary | ICD-10-CM | POA: Diagnosis not present

## 2022-11-20 DIAGNOSIS — M5137 Other intervertebral disc degeneration, lumbosacral region: Secondary | ICD-10-CM | POA: Diagnosis not present

## 2022-11-20 DIAGNOSIS — M9905 Segmental and somatic dysfunction of pelvic region: Secondary | ICD-10-CM | POA: Diagnosis not present

## 2022-11-20 DIAGNOSIS — M9903 Segmental and somatic dysfunction of lumbar region: Secondary | ICD-10-CM | POA: Diagnosis not present

## 2022-11-20 DIAGNOSIS — M9904 Segmental and somatic dysfunction of sacral region: Secondary | ICD-10-CM | POA: Diagnosis not present

## 2022-11-20 DIAGNOSIS — M9901 Segmental and somatic dysfunction of cervical region: Secondary | ICD-10-CM | POA: Diagnosis not present

## 2022-11-20 DIAGNOSIS — M5386 Other specified dorsopathies, lumbar region: Secondary | ICD-10-CM | POA: Diagnosis not present

## 2022-11-20 DIAGNOSIS — M5032 Other cervical disc degeneration, mid-cervical region, unspecified level: Secondary | ICD-10-CM | POA: Diagnosis not present

## 2022-11-22 DIAGNOSIS — M5032 Other cervical disc degeneration, mid-cervical region, unspecified level: Secondary | ICD-10-CM | POA: Diagnosis not present

## 2022-11-22 DIAGNOSIS — M9903 Segmental and somatic dysfunction of lumbar region: Secondary | ICD-10-CM | POA: Diagnosis not present

## 2022-11-22 DIAGNOSIS — M9901 Segmental and somatic dysfunction of cervical region: Secondary | ICD-10-CM | POA: Diagnosis not present

## 2022-11-22 DIAGNOSIS — M9905 Segmental and somatic dysfunction of pelvic region: Secondary | ICD-10-CM | POA: Diagnosis not present

## 2022-11-22 DIAGNOSIS — M9904 Segmental and somatic dysfunction of sacral region: Secondary | ICD-10-CM | POA: Diagnosis not present

## 2022-11-22 DIAGNOSIS — M5432 Sciatica, left side: Secondary | ICD-10-CM | POA: Diagnosis not present

## 2022-11-22 DIAGNOSIS — M5137 Other intervertebral disc degeneration, lumbosacral region: Secondary | ICD-10-CM | POA: Diagnosis not present

## 2022-11-22 DIAGNOSIS — M5386 Other specified dorsopathies, lumbar region: Secondary | ICD-10-CM | POA: Diagnosis not present

## 2022-11-27 DIAGNOSIS — M9904 Segmental and somatic dysfunction of sacral region: Secondary | ICD-10-CM | POA: Diagnosis not present

## 2022-11-27 DIAGNOSIS — M5432 Sciatica, left side: Secondary | ICD-10-CM | POA: Diagnosis not present

## 2022-11-27 DIAGNOSIS — M9901 Segmental and somatic dysfunction of cervical region: Secondary | ICD-10-CM | POA: Diagnosis not present

## 2022-11-27 DIAGNOSIS — M5386 Other specified dorsopathies, lumbar region: Secondary | ICD-10-CM | POA: Diagnosis not present

## 2022-11-27 DIAGNOSIS — M9903 Segmental and somatic dysfunction of lumbar region: Secondary | ICD-10-CM | POA: Diagnosis not present

## 2022-11-27 DIAGNOSIS — M9905 Segmental and somatic dysfunction of pelvic region: Secondary | ICD-10-CM | POA: Diagnosis not present

## 2022-11-27 DIAGNOSIS — M5032 Other cervical disc degeneration, mid-cervical region, unspecified level: Secondary | ICD-10-CM | POA: Diagnosis not present

## 2022-11-27 DIAGNOSIS — M5137 Other intervertebral disc degeneration, lumbosacral region: Secondary | ICD-10-CM | POA: Diagnosis not present

## 2022-11-29 DIAGNOSIS — M9904 Segmental and somatic dysfunction of sacral region: Secondary | ICD-10-CM | POA: Diagnosis not present

## 2022-11-29 DIAGNOSIS — M9905 Segmental and somatic dysfunction of pelvic region: Secondary | ICD-10-CM | POA: Diagnosis not present

## 2022-11-29 DIAGNOSIS — M5137 Other intervertebral disc degeneration, lumbosacral region: Secondary | ICD-10-CM | POA: Diagnosis not present

## 2022-11-29 DIAGNOSIS — M5386 Other specified dorsopathies, lumbar region: Secondary | ICD-10-CM | POA: Diagnosis not present

## 2022-11-29 DIAGNOSIS — M5032 Other cervical disc degeneration, mid-cervical region, unspecified level: Secondary | ICD-10-CM | POA: Diagnosis not present

## 2022-11-29 DIAGNOSIS — M9901 Segmental and somatic dysfunction of cervical region: Secondary | ICD-10-CM | POA: Diagnosis not present

## 2022-11-29 DIAGNOSIS — M9903 Segmental and somatic dysfunction of lumbar region: Secondary | ICD-10-CM | POA: Diagnosis not present

## 2022-11-29 DIAGNOSIS — M5432 Sciatica, left side: Secondary | ICD-10-CM | POA: Diagnosis not present

## 2022-12-11 DIAGNOSIS — L308 Other specified dermatitis: Secondary | ICD-10-CM | POA: Diagnosis not present

## 2022-12-11 DIAGNOSIS — B351 Tinea unguium: Secondary | ICD-10-CM | POA: Diagnosis not present

## 2022-12-11 DIAGNOSIS — B078 Other viral warts: Secondary | ICD-10-CM | POA: Diagnosis not present

## 2023-05-19 DIAGNOSIS — Z125 Encounter for screening for malignant neoplasm of prostate: Secondary | ICD-10-CM | POA: Diagnosis not present

## 2023-05-19 DIAGNOSIS — E785 Hyperlipidemia, unspecified: Secondary | ICD-10-CM | POA: Diagnosis not present

## 2023-05-19 DIAGNOSIS — R7303 Prediabetes: Secondary | ICD-10-CM | POA: Diagnosis not present

## 2023-07-30 DIAGNOSIS — M1612 Unilateral primary osteoarthritis, left hip: Secondary | ICD-10-CM | POA: Diagnosis not present

## 2023-07-30 DIAGNOSIS — M19031 Primary osteoarthritis, right wrist: Secondary | ICD-10-CM | POA: Diagnosis not present

## 2023-10-23 DIAGNOSIS — I700 Atherosclerosis of aorta: Secondary | ICD-10-CM | POA: Diagnosis not present

## 2023-10-23 DIAGNOSIS — J449 Chronic obstructive pulmonary disease, unspecified: Secondary | ICD-10-CM | POA: Diagnosis not present

## 2023-10-23 DIAGNOSIS — G621 Alcoholic polyneuropathy: Secondary | ICD-10-CM | POA: Diagnosis not present

## 2023-10-23 DIAGNOSIS — I10 Essential (primary) hypertension: Secondary | ICD-10-CM | POA: Diagnosis not present

## 2023-10-23 DIAGNOSIS — E785 Hyperlipidemia, unspecified: Secondary | ICD-10-CM | POA: Diagnosis not present

## 2023-10-23 DIAGNOSIS — M1612 Unilateral primary osteoarthritis, left hip: Secondary | ICD-10-CM | POA: Diagnosis not present

## 2023-10-23 DIAGNOSIS — N529 Male erectile dysfunction, unspecified: Secondary | ICD-10-CM | POA: Diagnosis not present

## 2023-10-23 DIAGNOSIS — R7303 Prediabetes: Secondary | ICD-10-CM | POA: Diagnosis not present

## 2023-10-23 DIAGNOSIS — F17200 Nicotine dependence, unspecified, uncomplicated: Secondary | ICD-10-CM | POA: Diagnosis not present

## 2023-10-23 DIAGNOSIS — G8929 Other chronic pain: Secondary | ICD-10-CM | POA: Diagnosis not present

## 2023-11-20 DIAGNOSIS — M25532 Pain in left wrist: Secondary | ICD-10-CM | POA: Diagnosis not present

## 2023-11-20 DIAGNOSIS — M25531 Pain in right wrist: Secondary | ICD-10-CM | POA: Diagnosis not present

## 2023-12-16 DIAGNOSIS — Z01810 Encounter for preprocedural cardiovascular examination: Secondary | ICD-10-CM | POA: Diagnosis not present

## 2023-12-16 DIAGNOSIS — Z01818 Encounter for other preprocedural examination: Secondary | ICD-10-CM | POA: Diagnosis not present

## 2023-12-16 DIAGNOSIS — M25511 Pain in right shoulder: Secondary | ICD-10-CM | POA: Diagnosis not present

## 2023-12-16 LAB — LAB REPORT - SCANNED: EGFR: 97

## 2023-12-17 DIAGNOSIS — S46011A Strain of muscle(s) and tendon(s) of the rotator cuff of right shoulder, initial encounter: Secondary | ICD-10-CM | POA: Diagnosis not present

## 2023-12-17 DIAGNOSIS — M1612 Unilateral primary osteoarthritis, left hip: Secondary | ICD-10-CM | POA: Diagnosis not present

## 2024-01-23 DIAGNOSIS — M1612 Unilateral primary osteoarthritis, left hip: Secondary | ICD-10-CM | POA: Diagnosis not present

## 2024-03-10 DIAGNOSIS — M1612 Unilateral primary osteoarthritis, left hip: Secondary | ICD-10-CM | POA: Diagnosis not present

## 2024-03-10 NOTE — Progress Notes (Signed)
 Surgery orders requested via Epic inbox.

## 2024-03-16 NOTE — Patient Instructions (Signed)
 SURGICAL WAITING ROOM VISITATION  Patients having surgery or a procedure may have no more than 2 support people in the waiting area - these visitors may rotate.    Children under the age of 24 must have an adult with them who is not the patient.  Due to an increase in RSV and influenza rates and associated hospitalizations, children ages 39 and under may not visit patients in [facility] hospitals.  Visitors with respiratory illnesses are discouraged from visiting and should remain at home.  If the patient needs to stay at the hospital during part of their recovery, the visitor guidelines for inpatient rooms apply. Pre-op nurse will coordinate an appropriate time for 1 support person to accompany patient in pre-op.  This support person may not rotate.    Please refer to the [facility] website for the visitor guidelines for Inpatients (after your surgery is over and you are in a regular room).       Your procedure is scheduled on:  03/30/2024    Report to [facility] Main Entrance    Report to admitting at  0945 AM   Call this number if you have problems the morning of surgery [phone]   Do not eat food :After Midnight.   After Midnight you may have the following liquids until __ 0915____ AM DAY OF SURGERY  Water Non-Citrus Juices (without pulp, NO RED-Apple, White grape, White cranberry) Black Coffee (NO MILK/CREAM OR CREAMERS, sugar ok)  Clear Tea (NO MILK/CREAM OR CREAMERS, sugar ok) regular and decaf                             Plain Jell-O (NO RED)                                           Fruit ices (not with fruit pulp, NO RED)                                     Popsicles (NO RED)                                                               Sports drinks like Gatorade (NO RED)                   The day of surgery:  Drink ONE (1) Pre-Surgery Clear Ensure or G2 at 0915 AM  ( have completed by ) the morning of surgery. Drink in one sitting. Do not sip.   This drink was given to you during your hospital  pre-op appointment visit. Nothing else to drink after completing the  Pre-Surgery Clear Ensure or G2.          If you have questions, please contact your surgeon's office.       Oral Hygiene is also important to reduce your risk of infection.  Remember - BRUSH YOUR TEETH THE MORNING OF SURGERY WITH YOUR REGULAR TOOTHPASTE  DENTURES WILL BE REMOVED PRIOR TO SURGERY PLEASE DO NOT APPLY "Poly grip" OR ADHESIVES!!!   Do NOT smoke after Midnight   Stop all vitamins and herbal supplements 7 days before surgery.   Take these medicines the morning of surgery with A SIP OF WATER:  amlodipine   DO NOT TAKE ANY ORAL DIABETIC MEDICATIONS DAY OF YOUR SURGERY  Bring CPAP mask and tubing day of surgery.                              You may not have any metal on your body including hair pins, jewelry, and body piercing             Do not wear make-up, lotions, powders, perfumes/cologne, or deodorant  Do not wear nail polish including gel and S&S, artificial/acrylic nails, or any other type of covering on natural nails including finger and toenails. If you have artificial nails, gel coating, etc. that needs to be removed by a nail salon please have this removed prior to surgery or surgery may need to be canceled/ delayed if the surgeon/ anesthesia feels like they are unable to be safely monitored.   Do not shave  48 hours prior to surgery.               Men may shave face and neck.   Do not bring valuables to the hospital. [facility] IS NOT             RESPONSIBLE   FOR VALUABLES.   Contacts, glasses, dentures or bridgework may not be worn into surgery.   Bring small overnight bag day of surgery.   DO NOT BRING YOUR HOME MEDICATIONS TO THE HOSPITAL. PHARMACY WILL DISPENSE MEDICATIONS LISTED ON YOUR MEDICATION LIST TO YOU DURING YOUR ADMISSION IN THE HOSPITAL!    Patients discharged on the day of surgery  will not be allowed to drive home.  Someone NEEDS to stay with you for the first 24 hours after anesthesia.   Special Instructions: Bring a copy of your healthcare power of attorney and living will documents the day of surgery if you haven't scanned them before.              Please read over the following fact sheets you were given: IF YOU HAVE QUESTIONS ABOUT YOUR PRE-OP INSTRUCTIONS PLEASE CALL [phone]   If you received a COVID test during your pre-op visit  it is requested that you wear a mask when out in public, stay away from anyone that may not be feeling well and notify your surgeon if you develop symptoms. If you test positive for Covid or have been in contact with anyone that has tested positive in the last 10 days please notify you surgeon.      Pre-operative 5 CHG Bath Instructions   You can play a key role in reducing the risk of infection after surgery. Your skin needs to be as free of germs as possible. You can reduce the number of germs on your skin by washing with CHG (chlorhexidine gluconate) soap before surgery. CHG is an antiseptic soap that kills germs and continues to kill germs even after washing.   DO NOT use if you have an allergy to chlorhexidine/CHG or antibacterial soaps. If your skin becomes reddened or irritated, stop using the CHG and notify one of our RNs at  [phone].   Please shower with the CHG soap starting 4 days before surgery using the following schedule:     Please keep in mind the following:  DO NOT shave, including legs and underarms, starting the day of your first shower.   You may shave your face at any point before/day of surgery.  Place clean sheets on your bed the day you start using CHG soap. Use a clean washcloth (not used since being washed) for each shower. DO NOT sleep with pets once you start using the CHG.   CHG Shower Instructions:  If you choose to wash your hair and private area, wash first with your normal shampoo/soap.   After you use shampoo/soap, rinse your hair and body thoroughly to remove shampoo/soap residue.  Turn the water OFF and apply about 3 tablespoons (45 ml) of CHG soap to a CLEAN washcloth.  Apply CHG soap ONLY FROM YOUR NECK DOWN TO YOUR TOES (washing for 3-5 minutes)  DO NOT use CHG soap on face, private areas, open wounds, or sores.  Pay special attention to the area where your surgery is being performed.  If you are having back surgery, having someone wash your back for you may be helpful. Wait 2 minutes after CHG soap is applied, then you may rinse off the CHG soap.  Pat dry with a clean towel  Put on clean clothes/pajamas   If you choose to wear lotion, please use ONLY the CHG-compatible lotions on the back of this paper.     Additional instructions for the day of surgery: DO NOT APPLY any lotions, deodorants, cologne, or perfumes.   Put on clean/comfortable clothes.  Brush your teeth.  Ask your nurse before applying any prescription medications to the skin.      CHG Compatible Lotions   Aveeno Moisturizing lotion  Cetaphil Moisturizing Cream  Cetaphil Moisturizing Lotion  Clairol Herbal Essence Moisturizing Lotion, Dry Skin  Clairol Herbal Essence Moisturizing Lotion, Extra Dry Skin  Clairol Herbal Essence Moisturizing Lotion, Normal Skin  Curel Age Defying Therapeutic Moisturizing Lotion with Alpha Hydroxy  Curel Extreme Care Body Lotion  Curel Soothing Hands Moisturizing Hand Lotion  Curel Therapeutic Moisturizing Cream, Fragrance-Free  Curel Therapeutic Moisturizing Lotion, Fragrance-Free  Curel Therapeutic Moisturizing Lotion, Original Formula  Eucerin Daily Replenishing Lotion  Eucerin Dry Skin Therapy Plus Alpha Hydroxy Crme  Eucerin Dry Skin Therapy Plus Alpha Hydroxy Lotion  Eucerin Original Crme  Eucerin Original Lotion  Eucerin Plus Crme Eucerin Plus Lotion  Eucerin TriLipid Replenishing Lotion  Keri Anti-Bacterial Hand Lotion  Keri Deep Conditioning  Original Lotion Dry Skin Formula Softly Scented  Keri Deep Conditioning Original Lotion, Fragrance Free Sensitive Skin Formula  Keri Lotion Fast Absorbing Fragrance Free Sensitive Skin Formula  Keri Lotion Fast Absorbing Softly Scented Dry Skin Formula  Keri Original Lotion  Keri Skin Renewal Lotion Keri Silky Smooth Lotion  Keri Silky Smooth Sensitive Skin Lotion  Nivea Body Creamy Conditioning Oil  Nivea Body Extra Enriched Teacher, adult education Moisturizing Lotion Nivea Crme  Nivea Skin Firming Lotion  NutraDerm 30 Skin Lotion  NutraDerm Skin Lotion  NutraDerm Therapeutic Skin Cream  NutraDerm Therapeutic Skin Lotion  ProShield Protective Hand Cream  Provon moisturizing lotion

## 2024-03-16 NOTE — Progress Notes (Addendum)
 Anesthesia Review:  PCP: Caleb Turmel LVO 12/16/23 for preop clearance .note in Media Tab along with clearance dated 12/17/23.  Cardiologist : none   PPM/ ICD: Device Orders: Rep Notified:  Chest x-ray : EKG : 12/18/2023  Echo : Stress test: Cardiac Cath :   Activity level: can do a flight of stairs without difficutly  Sleep Study/ CPAP : none  Fasting Blood Sugar :      / Checks Blood Sugar -- times a day:    Blood Thinner/ Instructions /Last Dose: ASA / Instructions/ Last Dose :   Aspirin  325mg  prn- pt has stopped prior to 03/22/24

## 2024-03-16 NOTE — Care Plan (Signed)
 Ortho Bundle Case Management Note  Patient Details  Name: Rodney Frey MRN: [id number] Date of Birth: Jan 09, 1951  patient met with PA in the office for H&P. will discharge to home with family to assist. rolling walker ordered. OPPT set up with [facility]. discharge instructions discussed. CM spoke with him on the phone today. Patient and MD in agreement with plan. Choice offered                     DME Arranged:  Walker rolling DME Agency:  [employer]  HH Arranged:    HH Agency:     Additional Comments: Please contact me with any questions of if this plan should need to change.  Cornelia Dieter,  RN,BSN,MHA,CCM  [facility] Orthopaedic Specialist  [phone] 03/16/2024, 1:13 PM

## 2024-03-17 ENCOUNTER — Encounter (HOSPITAL_COMMUNITY)
Admission: RE | Admit: 2024-03-17 | Discharge: 2024-03-17 | Disposition: A | Discharge status: Home / Self Care | Source: Ambulatory Visit

## 2024-03-22 ENCOUNTER — Other Ambulatory Visit: Payer: Self-pay

## 2024-03-22 ENCOUNTER — Encounter (HOSPITAL_COMMUNITY): Payer: Self-pay

## 2024-03-22 ENCOUNTER — Encounter (HOSPITAL_COMMUNITY)
Admission: RE | Admit: 2024-03-22 | Discharge: 2024-03-22 | Disposition: A | Discharge status: Home / Self Care | Source: Ambulatory Visit | Attending: Orthopedic Surgery | Admitting: Orthopedic Surgery

## 2024-03-22 VITALS — BP 138/71 | HR 77 | Temp 98.4°F | Resp 16 | Ht 66.0 in | Wt 171.0 lb

## 2024-03-22 DIAGNOSIS — Z01818 Encounter for other preprocedural examination: Secondary | ICD-10-CM

## 2024-03-22 DIAGNOSIS — Z01812 Encounter for preprocedural laboratory examination: Secondary | ICD-10-CM | POA: Diagnosis not present

## 2024-03-22 HISTORY — DX: Unspecified osteoarthritis, unspecified site: M19.90

## 2024-03-22 HISTORY — DX: Prediabetes: R73.03

## 2024-03-22 HISTORY — DX: Essential (primary) hypertension: I10

## 2024-03-22 LAB — BASIC METABOLIC PANEL WITH GFR
Anion gap: 10 (ref 5–15)
BUN: 14 mg/dL (ref 8–23)
CO2: 25 mmol/L (ref 22–32)
Calcium: 9.1 mg/dL (ref 8.9–10.3)
Chloride: 101 mmol/L (ref 98–111)
Creatinine, Ser: 0.63 mg/dL (ref 0.61–1.24)
GFR, Estimated: >60 mL/min (ref >60)
Glucose, Bld: 126 mg/dL — ABNORMAL HIGH (ref 70–99)
Potassium: 3.8 mmol/L (ref 3.5–5.1)
Sodium: 136 mmol/L (ref 135–145)

## 2024-03-22 LAB — CBC
HCT: 45.8 % (ref 39.0–52.0)
Hemoglobin: 15.1 g/dL (ref 13.0–17.0)
MCH: 29 pg (ref 26.0–34.0)
MCHC: 33 g/dL (ref 30.0–36.0)
MCV: 88.1 fL (ref 80.0–100.0)
Platelets: 259 10*3/uL (ref 150–400)
RBC: 5.2 MIL/uL (ref 4.22–5.81)
RDW: 14 % (ref 11.5–15.5)
WBC: 9.4 10*3/uL (ref 4.0–10.5)
nRBC: 0 % (ref 0.0–0.2)

## 2024-03-22 LAB — SURGICAL PCR SCREEN
MRSA, PCR: NEGATIVE
Staphylococcus aureus: NEGATIVE

## 2024-03-22 NOTE — H&P (Signed)
 HIP ARTHROPLASTY ADMISSION H&P  Patient ID: Rodney Frey MRN: [id number] DOB/AGE: 1951/10/11 73 y.o.  Chief Complaint: left hip pain.  Planned Procedure Date: 03/30/24 Medical Clearance by Harrie Limb, PA-C   HPI: Rodney Frey is a 73 y.o. male who presents for evaluation of left hip OA. The patient has a history of pain and functional disability in the left hip due to arthritis and has failed non-surgical conservative treatments for greater than 12 weeks to include NSAID's and/or analgesics, use of assistive devices, and activity modification.  Onset of symptoms was gradual, starting 8 years ago with gradually worsening course since that time. The patient noted no past surgery on the left hip.  Patient currently rates pain at 5 out of 10 with activity. Patient has worsening of pain with activity and weight bearing and pain that interferes with activities of daily living.  Patient has evidence of joint space narrowing by imaging studies.  There is no active infection.  Past Medical History:  Diagnosis Date   Arthritis    Carpal tunnel syndrome, bilateral    Hypertension    Lung mass    Pre-diabetes    Past Surgical History:  Procedure Laterality Date   LUNG SURGERY  2013   THUMB ARTHROSCOPY  08/2019   No Known Allergies Prior to Admission medications   Medication Sig Start Date End Date Taking? Authorizing Provider  amLODipine (NORVASC) 5 MG tablet Take 1 tablet by mouth daily.   Yes [provider]  Ascorbic Acid (VITAMIN C) 1000 MG tablet Take 1,000 mg by mouth daily.   Yes [provider]  aspirin EC 325 MG tablet Take 650 mg by mouth every 6 (six) hours as needed for moderate pain (pain score 4-6).   Yes [provider]  aspirin-acetaminophen-caffeine (EXCEDRIN MIGRAINE) 250-250-65 MG tablet Take 2 tablets by mouth every 6 (six) hours as needed for headache.   Yes [provider]  atorvastatin (LIPITOR) 10 MG tablet Take 1 tablet by  mouth daily.   Yes [provider]  cholecalciferol (VITAMIN D3) 25 MCG (1000 UNIT) tablet Take 1,000 Units by mouth daily.   Yes [provider]  cyanocobalamin (VITAMIN B12) 1000 MCG tablet Take 1,000 mcg by mouth daily.   Yes [provider]  hydrochlorothiazide (HYDRODIURIL) 25 MG tablet Take 25 mg by mouth daily.   Yes [provider]  Ibuprofen-diphenhydrAMINE HCl (ADVIL PM) 200-25 MG CAPS Take 2 tablets by mouth at bedtime.   Yes [provider]  meloxicam  (MOBIC ) 7.5 MG tablet Take 1 tablet by mouth daily.   Yes [provider]  HYDROcodone -acetaminophen (NORCO/VICODIN) 5-325 MG tablet Take 1 tablet by mouth every 6 (six) hours as needed for moderate pain (pain score 4-6).    [provider]   Social History   Socioeconomic History   Marital status: Married    Spouse name: Not on file   Number of children: Not on file   Years of education: Not on file   Highest education level: Not on file  Occupational History   Not on file  Tobacco Use   Smoking status: Former    Current packs/day: 1.00    Average packs/day: 1 pack/day for 40.0 years (40.0 ttl pk-yrs)    Types: Cigarettes   Smokeless tobacco: Never  Vaping Use   Vaping status: Never Used  Substance and Sexual Activity   Alcohol use: Yes   Drug use: No   Sexual activity: Not on file  Other  Topics Concern   Not on file  Social History Narrative   Not on file   Social Drivers of Health   Financial Resource Strain: Not on file  Food Insecurity: Not on file  Transportation Needs: Not on file  Physical Activity: Not on file  Stress: Not on file  Social Connections: Not on file   Family History  Problem Relation Age of Onset   High blood pressure Mother    Stroke Brother     ROS: Currently denies lightheadedness, dizziness, Fever, chills, CP, SOB.   No personal history of DVT, PE, MI, or CVA. No loose teeth or dentures All other systems have been  reviewed and were otherwise currently negative with the exception of those mentioned in the HPI and as above.  Objective: Vitals: Height: 5?6?Aaron Aas  Weight: 176.0 pounds.  BMI: 28.4.  Blood pressure: 152/75.  O2 SAT: 94% on room air.  Temperature: 98.8.  Pulse: 87.   Physical Exam: General: Alert, NAD. Trendelenberg Gait  HEENT: EOMI, Good Neck Extension  Pulm: No increased work of breathing.  Clear B/L A/P w/o crackle or wheeze.  CV: RRR, No m/g/r appreciated  GI: soft, NT, ND Neuro: Neuro without gross focal deficit.  Sensation intact distally Skin: No lesions in the area of chief complaint MSK/Surgical Site: He has about 0-40 degrees of forward flexion at the left hip.  About 10 degrees of external rotation.  No active internal rotation.  Severe pain elicited with flexion and internal and external rotation at the hip.  EHL and FHL are intact.  Distal sensation is intact, though decreased due to neuropathy.    Imaging Review Plain radiographs demonstrate severe degenerative joint disease of the left hip.   Preoperative templating of the joint replacement has been completed, documented, and submitted to the Operating Room personnel in order to optimize intra-operative equipment management.  Assessment: left hip OA Active Problems:   * No active hospital problems. *   Plan: Plan for Procedure(s): ARTHROPLASTY, HIP, TOTAL,POSTERIOR APPROACH  The patient history, physical exam, clinical judgement of the provider and imaging are consistent with end stage degenerative joint disease and total joint arthroplasty is deemed medically necessary. The treatment options including medical management, injection therapy, and arthroplasty were discussed at length. The risks and benefits of Procedure(s): ARTHROPLASTY, HIP, TOTAL,POSTERIOR APPROACH were presented and reviewed.  The risks of nonoperative treatment, versus surgical intervention including but not limited to continued pain, aseptic loosening,  stiffness, dislocation/subluxation, infection, bleeding, nerve injury, blood clots, cardiopulmonary complications, morbidity, mortality, among others were discussed. The patient verbalizes understanding and wishes to proceed with the plan.  Patient is being admitted for surgery, pain control, PT, prophylactic antibiotics, VTE prophylaxis, progressive ambulation, ADL's and discharge planning.    The patient does meet the criteria for TXA which will be used perioperatively.   ASA 325 mg  will be used postoperatively for DVT prophylaxis in addition to SCDs, and early ambulation.   Monta Police K Breia Ocampo, PA-C 03/22/2024 4:07 PM

## 2024-03-29 NOTE — Anesthesia Preprocedure Evaluation (Signed)
 Anesthesia Evaluation  Patient identified by MRN, date of birth, ID band Patient awake    Reviewed: Allergy & Precautions, NPO status , Patient's Chart, lab work & pertinent test results  History of Anesthesia Complications Negative for: history of anesthetic complications  Airway Mallampati: II  TM Distance: >3 FB Neck ROM: Full    Dental no notable dental hx. (+) Missing, Edentulous Upper, Poor Dentition   Pulmonary former smoker Lung mass    Pulmonary exam normal breath sounds clear to auscultation       Cardiovascular hypertension, Pt. on medications (-) angina (-) Past MI Normal cardiovascular exam Rhythm:Regular Rate:Normal     Neuro/Psych Lumbar sacral spondylsis feet numbness   Neuromuscular disease  negative psych ROS   GI/Hepatic negative GI ROS, Neg liver ROS,,,  Endo/Other    Renal/GU Lab Results      Component                Value               Date                           K                        3.8                 03/22/2024                CO2                      25                  03/22/2024                BUN                      14                  03/22/2024                CREATININE               0.63                03/22/2024                GFRNONAA                 >60                 03/22/2024                  GLUCOSE                  126 (H)             03/22/2024                Musculoskeletal  (+) Arthritis , Osteoarthritis,    Abdominal   Peds  Hematology Lab Results      Component                Value               Date                      WBC  9.4                 03/22/2024                HGB                      15.1                03/22/2024                HCT                      45.8                03/22/2024                MCV                      88.1                03/22/2024                PLT                      259                 03/22/2024               Anesthesia Other Findings   Reproductive/Obstetrics                             Anesthesia Physical Anesthesia Plan  ASA: 2  Anesthesia Plan: General and Spinal   Post-op Pain Management: Tylenol PO (pre-op)*   Induction: Intravenous  PONV Risk Score and Plan: 3 and Treatment may vary due to age or medical condition, Ondansetron and Dexamethasone   Airway Management Planned: Natural Airway  Additional Equipment: None  Intra-op Plan:   Post-operative Plan:   Informed Consent: I have reviewed the patients History and Physical, chart, labs and discussed the procedure including the risks, benefits and alternatives for the proposed anesthesia with the patient or authorized representative who has indicated his/her understanding and acceptance.     Dental advisory given  Plan Discussed with: CRNA and Surgeon  Anesthesia Plan Comments:        Anesthesia Quick Evaluation

## 2024-03-30 ENCOUNTER — Other Ambulatory Visit: Payer: Self-pay

## 2024-03-30 ENCOUNTER — Encounter (HOSPITAL_COMMUNITY): Payer: Self-pay | Admitting: Orthopedic Surgery

## 2024-03-30 ENCOUNTER — Ambulatory Visit (HOSPITAL_COMMUNITY)

## 2024-03-30 ENCOUNTER — Ambulatory Visit (HOSPITAL_COMMUNITY): Payer: Self-pay | Admitting: Anesthesiology

## 2024-03-30 ENCOUNTER — Ambulatory Visit (HOSPITAL_COMMUNITY)
Admission: RE | Admit: 2024-03-30 | Discharge: 2024-03-30 | Disposition: A | Discharge status: Home / Self Care | Source: Ambulatory Visit | Attending: Orthopedic Surgery | Admitting: Orthopedic Surgery

## 2024-03-30 ENCOUNTER — Encounter (HOSPITAL_COMMUNITY)
Admission: RE | Disposition: A | Discharge status: Home / Self Care | Payer: Self-pay | Source: Ambulatory Visit | Attending: Orthopedic Surgery

## 2024-03-30 ENCOUNTER — Ambulatory Visit (HOSPITAL_COMMUNITY): Payer: Self-pay | Admitting: Physician Assistant

## 2024-03-30 DIAGNOSIS — M1612 Unilateral primary osteoarthritis, left hip: Secondary | ICD-10-CM | POA: Diagnosis not present

## 2024-03-30 DIAGNOSIS — Z87891 Personal history of nicotine dependence: Secondary | ICD-10-CM | POA: Insufficient documentation

## 2024-03-30 DIAGNOSIS — I10 Essential (primary) hypertension: Secondary | ICD-10-CM | POA: Insufficient documentation

## 2024-03-30 DIAGNOSIS — Z96642 Presence of left artificial hip joint: Secondary | ICD-10-CM | POA: Diagnosis not present

## 2024-03-30 DIAGNOSIS — Z471 Aftercare following joint replacement surgery: Secondary | ICD-10-CM | POA: Diagnosis not present

## 2024-03-30 DIAGNOSIS — Z01818 Encounter for other preprocedural examination: Secondary | ICD-10-CM

## 2024-03-30 DIAGNOSIS — R609 Edema, unspecified: Secondary | ICD-10-CM | POA: Diagnosis not present

## 2024-03-30 HISTORY — PX: TOTAL HIP ARTHROPLASTY: SHX124

## 2024-03-30 LAB — TYPE AND SCREEN
ABO/RH(D): A POS
Antibody Screen: NEGATIVE

## 2024-03-30 SURGERY — ARTHROPLASTY, HIP, TOTAL,POSTERIOR APPROACH
Anesthesia: General | Site: Hip | Laterality: Left

## 2024-03-30 MED ORDER — CEFAZOLIN SODIUM-DEXTROSE 2-4 GM/100ML-% IV SOLN
INTRAVENOUS | Status: AC
  Filled 2024-03-30: qty 100

## 2024-03-30 MED ORDER — TRANEXAMIC ACID-NACL 1000-0.7 MG/100ML-% IV SOLN
INTRAVENOUS | Status: AC
  Filled 2024-03-30: qty 100

## 2024-03-30 MED ORDER — ONDANSETRON HCL 4 MG/2ML IJ SOLN
INTRAMUSCULAR | Status: DC | PRN
Start: 2024-03-30 — End: 2024-03-30
  Administered 2024-03-30 (×2): 4 mg via INTRAVENOUS

## 2024-03-30 MED ORDER — FENTANYL CITRATE (PF) 100 MCG/2ML IJ SOLN
INTRAMUSCULAR | Status: DC | PRN
  Administered 2024-03-30 (×2): 50 ug via INTRAVENOUS

## 2024-03-30 MED ORDER — VANCOMYCIN HCL 1000 MG IV SOLR
INTRAVENOUS | Status: DC | PRN
  Administered 2024-03-30: 1000 mg via TOPICAL

## 2024-03-30 MED ORDER — SENNA-DOCUSATE SODIUM 8.6-50 MG PO TABS: 2.0000 | ORAL_TABLET | Freq: Every day | ORAL | 1 refills | Status: AC

## 2024-03-30 MED ORDER — BUPIVACAINE HCL (PF) 0.25 % IJ SOLN
INTRAMUSCULAR | Status: DC | PRN
  Administered 2024-03-30: 30 mL

## 2024-03-30 MED ORDER — PHENYLEPHRINE HCL-NACL 20-0.9 MG/250ML-% IV SOLN
INTRAVENOUS | Status: AC
  Filled 2024-03-30: qty 250

## 2024-03-30 MED ORDER — OXYCODONE HCL 5 MG PO TABS: 10.0000 mg | ORAL_TABLET | ORAL | Status: DC | PRN

## 2024-03-30 MED ORDER — FENTANYL CITRATE PF 50 MCG/ML IJ SOSY: 25.0000 ug | PREFILLED_SYRINGE | INTRAMUSCULAR | Status: DC | PRN

## 2024-03-30 MED ORDER — ONDANSETRON HCL 4 MG PO TABS: 4.0000 mg | ORAL_TABLET | Freq: Three times a day (TID) | ORAL | 0 refills | Status: AC | PRN

## 2024-03-30 MED ORDER — DEXAMETHASONE SODIUM PHOSPHATE 10 MG/ML IJ SOLN
INTRAMUSCULAR | Status: DC | PRN
  Administered 2024-03-30: 8 mg via INTRAVENOUS

## 2024-03-30 MED ORDER — PHENYLEPHRINE HCL-NACL 20-0.9 MG/250ML-% IV SOLN
INTRAVENOUS | Status: DC
Start: 2024-03-30 — End: 2024-03-30
  Filled 2024-03-30: qty 250

## 2024-03-30 MED ORDER — LACTATED RINGERS IV SOLN: INTRAVENOUS | Status: DC

## 2024-03-30 MED ORDER — TRANEXAMIC ACID-NACL 1000-0.7 MG/100ML-% IV SOLN
1000.0000 mg | Freq: Once | INTRAVENOUS | Status: AC
  Administered 2024-03-30: 1000 mg via INTRAVENOUS

## 2024-03-30 MED ORDER — HYDROMORPHONE HCL 1 MG/ML IJ SOLN: 0.5000 mg | INTRAMUSCULAR | Status: DC | PRN

## 2024-03-30 MED ORDER — LACTATED RINGERS IV BOLUS
500.0000 mL | Freq: Once | INTRAVENOUS | Status: AC
  Administered 2024-03-30: 500 mL via INTRAVENOUS

## 2024-03-30 MED ORDER — 0.9 % SODIUM CHLORIDE (POUR BTL) OPTIME
TOPICAL | Status: DC | PRN
  Administered 2024-03-30: 1000 mL

## 2024-03-30 MED ORDER — EPHEDRINE SULFATE-NACL 50-0.9 MG/10ML-% IV SOSY
PREFILLED_SYRINGE | INTRAVENOUS | Status: DC | PRN
Start: 2024-03-30 — End: 2024-03-30
  Administered 2024-03-30: 10 mg via INTRAVENOUS

## 2024-03-30 MED ORDER — ORAL CARE MOUTH RINSE: 15.0000 mL | Freq: Once | OROMUCOSAL | Status: AC

## 2024-03-30 MED ORDER — ONDANSETRON HCL 4 MG/2ML IJ SOLN: 4.0000 mg | Freq: Once | INTRAMUSCULAR | Status: DC | PRN

## 2024-03-30 MED ORDER — PHENYLEPHRINE HCL-NACL 20-0.9 MG/250ML-% IV SOLN
INTRAVENOUS | Status: DC | PRN
  Administered 2024-03-30: 25 ug/min via INTRAVENOUS

## 2024-03-30 MED ORDER — GLYCOPYRROLATE 0.2 MG/ML IJ SOLN
INTRAMUSCULAR | Status: DC | PRN
  Administered 2024-03-30 (×2): .2 mg via INTRAVENOUS

## 2024-03-30 MED ORDER — MIDAZOLAM HCL 2 MG/2ML IJ SOLN
INTRAMUSCULAR | Status: AC
Start: 2024-03-30 — End: ?
  Filled 2024-03-30: qty 2

## 2024-03-30 MED ORDER — ASPIRIN 325 MG PO TBEC: 325.0000 mg | DELAYED_RELEASE_TABLET | Freq: Two times a day (BID) | ORAL | 0 refills | Status: AC

## 2024-03-30 MED ORDER — MIDAZOLAM HCL 5 MG/5ML IJ SOLN
INTRAMUSCULAR | Status: DC | PRN
  Administered 2024-03-30: 2 mg via INTRAVENOUS

## 2024-03-30 MED ORDER — OXYCODONE HCL 5 MG PO TABS
5.0000 mg | ORAL_TABLET | ORAL | Status: DC | PRN
  Administered 2024-03-30: 5 mg via ORAL

## 2024-03-30 MED ORDER — ONDANSETRON HCL 4 MG PO TABS: 4.0000 mg | ORAL_TABLET | Freq: Four times a day (QID) | ORAL | Status: DC | PRN

## 2024-03-30 MED ORDER — BACLOFEN 10 MG PO TABS: 10.0000 mg | ORAL_TABLET | Freq: Three times a day (TID) | ORAL | 0 refills | Status: AC

## 2024-03-30 MED ORDER — DEXAMETHASONE SODIUM PHOSPHATE 4 MG/ML IJ SOLN
INTRAMUSCULAR | Status: DC | PRN
  Administered 2024-03-30: 8 mg via INTRAVENOUS

## 2024-03-30 MED ORDER — ACETAMINOPHEN 500 MG PO TABS
1000.0000 mg | ORAL_TABLET | Freq: Once | ORAL | Status: AC
  Administered 2024-03-30: 1000 mg via ORAL
  Filled 2024-03-30: qty 2

## 2024-03-30 MED ORDER — METHOCARBAMOL 500 MG PO TABS
ORAL_TABLET | ORAL | Status: AC
  Filled 2024-03-30: qty 1

## 2024-03-30 MED ORDER — CEFAZOLIN SODIUM-DEXTROSE 2-4 GM/100ML-% IV SOLN
2.0000 g | INTRAVENOUS | Status: AC
  Administered 2024-03-30: 2 g via INTRAVENOUS
  Filled 2024-03-30: qty 100

## 2024-03-30 MED ORDER — METHOCARBAMOL 1000 MG/10ML IJ SOLN: 500.0000 mg | Freq: Four times a day (QID) | INTRAMUSCULAR | Status: DC | PRN

## 2024-03-30 MED ORDER — ACETAMINOPHEN 10 MG/ML IV SOLN: 1000.0000 mg | Freq: Once | INTRAVENOUS | Status: DC | PRN

## 2024-03-30 MED ORDER — KETOROLAC TROMETHAMINE 30 MG/ML IJ SOLN
INTRAMUSCULAR | Status: AC
  Filled 2024-03-30: qty 1

## 2024-03-30 MED ORDER — TRANEXAMIC ACID-NACL 1000-0.7 MG/100ML-% IV SOLN
1000.0000 mg | INTRAVENOUS | Status: AC
  Administered 2024-03-30: 1000 mg via INTRAVENOUS
  Filled 2024-03-30: qty 100

## 2024-03-30 MED ORDER — POVIDONE-IODINE 10 % EX SWAB: 2.0000 | Freq: Once | CUTANEOUS | Status: DC

## 2024-03-30 MED ORDER — ONDANSETRON HCL 4 MG/2ML IJ SOLN: 4.0000 mg | Freq: Four times a day (QID) | INTRAMUSCULAR | Status: DC | PRN

## 2024-03-30 MED ORDER — CEFAZOLIN SODIUM-DEXTROSE 2-4 GM/100ML-% IV SOLN
2.0000 g | Freq: Four times a day (QID) | INTRAVENOUS | Status: DC
  Administered 2024-03-30: 2 g via INTRAVENOUS

## 2024-03-30 MED ORDER — OXYCODONE HCL 5 MG PO TABS: 5.0000 mg | ORAL_TABLET | ORAL | 0 refills | Status: AC | PRN

## 2024-03-30 MED ORDER — KETOROLAC TROMETHAMINE 30 MG/ML IJ SOLN
INTRAMUSCULAR | Status: DC | PRN
Start: 2024-03-30 — End: 2024-03-30
  Administered 2024-03-30: 30 mg

## 2024-03-30 MED ORDER — LACTATED RINGERS IV SOLN: INTRAVENOUS | Status: DC | PRN

## 2024-03-30 MED ORDER — VANCOMYCIN HCL 1000 MG IV SOLR
INTRAVENOUS | Status: AC
  Filled 2024-03-30: qty 20

## 2024-03-30 MED ORDER — LACTATED RINGERS IV BOLUS
250.0000 mL | Freq: Once | INTRAVENOUS | Status: AC
  Administered 2024-03-30: 250 mL via INTRAVENOUS

## 2024-03-30 MED ORDER — POVIDONE-IODINE 7.5 % EX SOLN: Freq: Once | CUTANEOUS | Status: DC

## 2024-03-30 MED ORDER — CHLORHEXIDINE GLUCONATE 0.12 % MT SOLN
15.0000 mL | Freq: Once | OROMUCOSAL | Status: AC
  Administered 2024-03-30: 15 mL via OROMUCOSAL

## 2024-03-30 MED ORDER — FENTANYL CITRATE (PF) 100 MCG/2ML IJ SOLN
INTRAMUSCULAR | Status: AC
  Filled 2024-03-30: qty 2

## 2024-03-30 MED ORDER — METHOCARBAMOL 500 MG PO TABS
500.0000 mg | ORAL_TABLET | Freq: Four times a day (QID) | ORAL | Status: DC | PRN
  Administered 2024-03-30: 500 mg via ORAL

## 2024-03-30 MED ORDER — BUPIVACAINE-EPINEPHRINE (PF) 0.25% -1:200000 IJ SOLN
INTRAMUSCULAR | Status: AC
  Filled 2024-03-30: qty 30

## 2024-03-30 MED ORDER — OXYCODONE HCL 5 MG PO TABS
ORAL_TABLET | ORAL | Status: AC
  Filled 2024-03-30: qty 1

## 2024-03-30 MED ORDER — BUPIVACAINE HCL (PF) 0.5 % IJ SOLN
INTRAMUSCULAR | Status: DC | PRN
  Administered 2024-03-30: 12 mg via INTRATHECAL

## 2024-03-30 MED ORDER — PROPOFOL 500 MG/50ML IV EMUL
INTRAVENOUS | Status: DC | PRN
  Administered 2024-03-30: 75 ug/kg/min via INTRAVENOUS

## 2024-03-30 SURGICAL SUPPLY — 53 items
ANCHOR SUT KEITH ABD SZ2 STR (SUTURE) ×1 IMPLANT
BAG COUNTER SPONGE SURGICOUNT (BAG) IMPLANT
BIT DRILL 2.0X128 (BIT) ×1 IMPLANT
BLADE SAW SGTL 73X25 THK (BLADE) ×1 IMPLANT
CLSR STERI-STRIP ANTIMIC 1/2X4 (GAUZE/BANDAGES/DRESSINGS) ×2 IMPLANT
COVER SURGICAL LIGHT HANDLE (MISCELLANEOUS) ×1 IMPLANT
CUP ACETAB W/GRIPTION 54 (Plate) IMPLANT
DRAPE INCISE IOBAN 66X45 STRL (DRAPES) ×1 IMPLANT
DRAPE POUCH INSTRU U-SHP 10X18 (DRAPES) ×1 IMPLANT
DRAPE SHEET LG 3/4 BI-LAMINATE (DRAPES) ×1 IMPLANT
DRAPE SURG 17X11 SM STRL (DRAPES) ×1 IMPLANT
DRAPE SURG ORHT 6 SPLT 77X108 (DRAPES) ×2 IMPLANT
DRAPE U-SHAPE 47X51 STRL (DRAPES) ×1 IMPLANT
DRSG MEPILEX POST OP 4X12 (GAUZE/BANDAGES/DRESSINGS) ×1 IMPLANT
DURAPREP 26ML APPLICATOR (WOUND CARE) ×2 IMPLANT
ELECT BLADE TIP CTD 4 INCH (ELECTRODE) ×1 IMPLANT
ELECT PENCIL ROCKER SW 15FT (MISCELLANEOUS) ×1 IMPLANT
ELECT REM PT RETURN 15FT ADLT (MISCELLANEOUS) ×1 IMPLANT
ELIMINATOR HOLE APEX DEPUY (Hips) IMPLANT
FACESHIELD WRAPAROUND (MASK) ×1 IMPLANT
FACESHIELD WRAPAROUND OR TEAM (MASK) ×1 IMPLANT
GLOVE BIO SURGEON STRL SZ 6.5 (GLOVE) ×1 IMPLANT
GLOVE BIO SURGEON STRL SZ7.5 (GLOVE) ×1 IMPLANT
GLOVE BIOGEL PI IND STRL 7.0 (GLOVE) ×1 IMPLANT
GLOVE BIOGEL PI IND STRL 8 (GLOVE) ×1 IMPLANT
GOWN STRL SURGICAL XL XLNG (GOWN DISPOSABLE) ×2 IMPLANT
HEAD CERAMIC DELTA 36 PLUS 1.5 (Hips) IMPLANT
HOLDER FOLEY CATH W/STRAP (MISCELLANEOUS) IMPLANT
HOOD PEEL AWAY T7 (MISCELLANEOUS) ×3 IMPLANT
KIT BASIN OR (CUSTOM PROCEDURE TRAY) ×1 IMPLANT
KIT TURNOVER KIT A (KITS) ×1 IMPLANT
LINER NEUTRAL 54X36MM PLUS 4 (Hips) IMPLANT
MANIFOLD NEPTUNE II (INSTRUMENTS) ×1 IMPLANT
NDL SAFETY ECLIPSE 18X1.5 (NEEDLE) ×2 IMPLANT
NS IRRIG 1000ML POUR BTL (IV SOLUTION) ×1 IMPLANT
PACK TOTAL JOINT (CUSTOM PROCEDURE TRAY) ×1 IMPLANT
PROTECTOR NERVE ULNAR (MISCELLANEOUS) ×1 IMPLANT
SCREW 6.5MMX25MM (Screw) IMPLANT
STEM FEMORAL SZ 5MM STD ACTIS (Stem) IMPLANT
SUCTION TUBE FRAZIER 12FR DISP (SUCTIONS) ×1 IMPLANT
SUT ETHIBOND NAB CT1 #1 30IN (SUTURE) ×3 IMPLANT
SUT STRATAFIX 14 PDO 48 VLT (SUTURE) ×1 IMPLANT
SUT STRATAFIX PDO 1 14 VIOLET (SUTURE) ×1 IMPLANT
SUT VIC AB 1 CT1 36 (SUTURE) ×2 IMPLANT
SUT VIC AB 2-0 CT1 TAPERPNT 27 (SUTURE) ×2 IMPLANT
SUT VIC AB 3-0 SH 27X BRD (SUTURE) ×2 IMPLANT
SYR 30ML LL (SYRINGE) ×1 IMPLANT
SYR 3ML LL SCALE MARK (SYRINGE) ×1 IMPLANT
TOWEL GREEN STERILE FF (TOWEL DISPOSABLE) ×1 IMPLANT
TOWEL OR 17X26 10 PK STRL BLUE (TOWEL DISPOSABLE) ×1 IMPLANT
TRAY FOLEY MTR SLVR 16FR STAT (SET/KITS/TRAYS/PACK) ×1 IMPLANT
TUBE SUCTION HIGH CAP CLEAR NV (SUCTIONS) ×1 IMPLANT
WATER STERILE IRR 1000ML POUR (IV SOLUTION) ×2 IMPLANT

## 2024-03-30 NOTE — Anesthesia Postprocedure Evaluation (Signed)
 Anesthesia Post Note  Patient: Rodney Frey  Procedure(s) Performed: ARTHROPLASTY, HIP, TOTAL,POSTERIOR APPROACH (Left: Hip)     Patient location during evaluation: Nursing Unit Anesthesia Type: Spinal Level of consciousness: oriented and awake and alert Pain management: pain level controlled Vital Signs Assessment: post-procedure vital signs reviewed and stable Respiratory status: spontaneous breathing and respiratory function stable Cardiovascular status: blood pressure returned to baseline and stable Postop Assessment: no headache, no backache, no apparent nausea or vomiting and patient able to bend at knees Anesthetic complications: no  No notable events documented.  Last Vitals:  Vitals:   03/30/24 1215 03/30/24 1300  BP: 134/84 (!) 117/95  Pulse: 77 79  Resp:  20  Temp:    SpO2: 90% 95%    Last Pain:  Vitals:   03/30/24 1340  TempSrc:   PainSc: 2                  Rosalita Combe

## 2024-03-30 NOTE — Anesthesia Procedure Notes (Signed)
 Spinal  Patient location during procedure: OR Start time: 03/30/2024 7:28 AM End time: 03/30/2024 7:33 AM Reason for block: surgical anesthesia Staffing Performed: anesthesiologist  Anesthesiologist: Rosalita Combe, MD Performed by: Rosalita Combe, MD Authorized by: Rosalita Combe, MD   Preanesthetic Checklist Completed: patient identified, IV checked, risks and benefits discussed, surgical consent, monitors and equipment checked, pre-op evaluation and timeout performed Spinal Block Patient position: sitting Prep: DuraPrep and site prepped and draped Patient monitoring: heart rate, cardiac monitor, continuous pulse ox and blood pressure Approach: midline Location: L3-4 Injection technique: single-shot Needle Needle type: Pencan  Needle gauge: 24 G Needle length: 10 cm Needle insertion depth: 6 cm Assessment Sensory level: T4 Events: CSF return Additional Notes 1  Attempt (s). Pt tolerated procedure well.

## 2024-03-30 NOTE — Transfer of Care (Signed)
 Immediate Anesthesia Transfer of Care Note  Patient: Rodney Frey  Procedure(s) Performed: ARTHROPLASTY, HIP, TOTAL,POSTERIOR APPROACH (Left: Hip)  Patient Location: PACU  Anesthesia Type:MAC combined with regional for post-op pain  Level of Consciousness: awake and alert   Airway & Oxygen Therapy: Patient Spontanous Breathing and Patient connected to nasal cannula oxygen  Post-op Assessment: Report given to RN and Post -op Vital signs reviewed and stable  Post vital signs: Reviewed and stable  Last Vitals:  Vitals Value Taken Time  BP 127/82 03/30/24 0935  Temp    Pulse 76 03/30/24 0937  Resp 12 03/30/24 0937  SpO2 94 % 03/30/24 0937  Vitals shown include unfiled device data.  Last Pain:  Vitals:   03/30/24 0556  TempSrc: Oral  PainSc:          Complications: No notable events documented.

## 2024-03-30 NOTE — Interval H&P Note (Signed)
 History and Physical Interval Note:  03/30/2024 7:14 AM  Rodney Frey  has presented today for surgery, with the diagnosis of left hip OA.  The various methods of treatment have been discussed with the patient and family. After consideration of risks, benefits and other options for treatment, the patient has consented to  Procedure(s): ARTHROPLASTY, HIP, TOTAL,POSTERIOR APPROACH (Left) as a surgical intervention.  The patient's history has been reviewed, patient examined, no change in status, stable for surgery.  I have reviewed the patient's chart and labs.  Questions were answered to the patient's satisfaction.     Neville Barbone

## 2024-03-30 NOTE — Op Note (Signed)
 03/30/2024  9:19 AM  PATIENT:  Rodney Frey   MRN: [id number]  PRE-OPERATIVE DIAGNOSIS:  left hip osteoarthritis  POST-OPERATIVE DIAGNOSIS:  same  PROCEDURE:  Procedure(s): ARTHROPLASTY, HIP, TOTAL,POSTERIOR APPROACH LEFT  PREOPERATIVE INDICATIONS:    JOVONNIE Frey is an 73 y.o. male who has a diagnosis of left hip oa and elected for surgical management after failing conservative treatment.  The risks benefits and alternatives were discussed with the patient including but not limited to the risks of nonoperative treatment, versus surgical intervention including infection, bleeding, nerve injury, periprosthetic fracture, the need for revision surgery, dislocation, leg length discrepancy, blood clots, cardiopulmonary complications, morbidity, mortality, among others, and they were willing to proceed.     OPERATIVE REPORT     SURGEON:  Osa Blase, MD    ASSISTANT:  Hurshel Maidens, PA-C, (Present throughout the entire procedure,  necessary for completion of procedure in a timely manner, assisting with retraction, instrumentation, and closure)     ANESTHESIA:  spinal   ESTIMATED BLOOD LOSS: 350    COMPLICATIONS:  None.     UNIQUE ASPECTS OF THE CASE:  he had a slight stimulation of the sciatic nerve during the t-capsulotomy, but the nerve appeared intact.  He had a slight amount of shuck at the end, but leg lengths felt equal.  He was very stable.  I had about 1.26mm of anterior wall showing.  I medialized to the medial wall.  COMPONENTS:  Implant Name: CUP ACETAB W/GRIPTION 54 - [id number] Type: Plate Inv. Item: CUP ACETAB W/GRIPTION 54 Serial No.:  Manufacturer: [employer] Lot No.: [id number] LRB: Left No. Used: 1 Action: Implanted   Implant Name: SCREW 6.5MMX25MM - [id number] Type: Screw Inv. Item: SCREW 6.5MMX25MM Serial No.:  Manufacturer: [employer] Lot No.: [id number] LRB: Left No. Used: 1 Action: Implanted   Implant Name: STEM FEMORAL SZ  STD ACTIS - [id number] Type: Stem Inv. Item: STEM FEMORAL SZ STD ACTIS Serial No.:  Manufacturer: [employer] Lot No.: [id number] LRB: Left No. Used: 1 Action: Implanted   Implant Name: LINER NEUTRAL 54X36MM PLUS 4 - [id number] Type: Hips Inv. Item: LINER NEUTRAL 54X36MM PLUS 4 Serial No.:  Manufacturer: [employer] Lot No.: [id number] LRB: Left No. Used: 1 Action: Implanted   Implant Name: HEAD CERAMIC DELTA 36 PLUS 1.5 - [id number] Type: Hips Inv. Item: HEAD CERAMIC DELTA 36 PLUS 1.5 Serial No.:  Manufacturer: [employer] Lot No.: [id number] LRB: Left No. Used: 1 Action: Implanted   Implant Name: Nanetta Baars APEX DEPUY - [id number] Type: Hips Inv. Item: ELIMINATOR HOLE APEX DEPUY Serial No.:  Manufacturer: [employer] Lot No.: [id number] LRB: Left No. Used: 1 Action: Implanted     PROCEDURE IN DETAIL:   The patient was met in the holding area and  identified.  The appropriate hip was identified and marked at the operative site.  The patient was then transported to the OR  and  placed under anesthesia.  At that point, the patient was  placed in the lateral decubitus position with the operative side up and  secured to the operating room table and all bony prominences padded.     The operative lower extremity was prepped from the iliac crest to the distal leg.  Sterile draping was performed.  Time out was performed prior to incision.      A routine posterolateral approach was utilized via sharp dissection  carried down through the subcutaneous tissue.  Gross bleeders were Bovie coagulated.  The iliotibial band  was identified and incised along the length of the skin incision.  Self-retaining retractors were  inserted.  With the hip internally rotated, the short external rotators  were identified. The piriformis and capsule was tagged with Ethibond, and the hip capsule released in a T-type fashion.  The femoral neck was exposed, and I  resected the femoral neck using the appropriate jig. This was performed at approximately a thumb's breadth above the lesser trochanter.    I then exposed the deep acetabulum, cleared out any tissue including the ligamentum teres.  A wing retractor was placed.  After adequate visualization, I excised the labrum, and then sequentially reamed.  I placed the trial acetabulum, which seated nicely, and then impacted the real cup into place.  Appropriate version and inclination was confirmed clinically matching their bony anatomy, and also with the use of the jig.  I placed a cancellous screw to augment fixation.  A trial polyethylene liner was placed and the wing retractor removed.    I then prepared the proximal femur using the cookie-cutter, the lateralizing reamer, and then sequentially reamed and broached.  A trial broach, neck, and head was utilized, and I reduced the hip and it was found to have excellent stability with functional range of motion. The trial components were then removed, and the real polyethylene liner was placed.  I then impacted the real femoral prosthesis into place into the appropriate version, slightly anteverted to the normal anatomy, and I impacted the real head ball into place. The hip was then reduced and taken through functional range of motion and found to have excellent stability. Leg lengths were restored.  I then used a 2 mm drill bits to pass the Ethibond suture from the capsule and piriformis through the greater trochanter, and secured this. Excellent posterior capsular repair was achieved. I also closed the T in the capsule.  I placed vancomycin powder.    I then irrigated the hip copiously again, and repaired the fascia with Stratafix, followed by Vicryl for the subcutaneous tissue, Monocryl for the skin, Steri-Strips and sterile gauze. The wounds were injected. The patient was then awakened and returned to PACU in stable and satisfactory condition. There were no  complications.  Osa Blase, MD Orthopedic Surgeon [phone]   03/30/2024 9:19 AM

## 2024-03-30 NOTE — Evaluation (Signed)
 Physical Therapy Evaluation Patient Details Name: Rodney Frey MRN: [id number] DOB: 1950-12-21 Today's Date: 03/30/2024  History of Present Illness  73 yo male presents to therapy s/p L THA posterior approach on 03/30/2024 due to failure of conservative measures. Pt is currently L LE WBAT and no formal hip precautions. Pt PMH includes but is not limited to: HTN, lung mass, arthritis and carpal tunnel syndrome.  Clinical Impression    Rodney Frey is a 73 y.o. male POD 0 s/p L THA. Patient reports mod I with mobility at baseline. Patient is now limited by functional impairments (see PT problem list below) and requires CGA and cues for transfers and gait with RW. Patient was able to ambulate 40 feet with RW and CGA and cues for safe walker management. Patient educated on safe sequencing for stair mobility, fall risk prevention, pain management and goal, use of CP/ice and car transfers with running board pt and spouse verbalized understanding of safe guarding position for people assisting with mobility. Patient instructed in exercises to facilitate ROM and circulation reviewed and HO provided. Patient will benefit from continued skilled PT interventions to address impairments and progress towards PLOF. Patient has met mobility goals at adequate level for discharge home with family support and OPPT services scheduled for 5/14; will continue to follow if pt continues acute stay to progress towards Mod I goals.       If plan is discharge home, recommend the following: A little help with walking and/or transfers;A little help with bathing/dressing/bathroom;Assistance with cooking/housework;Assist for transportation   Can travel by private vehicle        Equipment Recommendations Rolling walker (2 wheels)  Recommendations for Other Services       Functional Status Assessment Patient has had a recent decline in their functional status and demonstrates the ability to make significant improvements  in function in a reasonable and predictable amount of time.     Precautions / Restrictions Precautions Precautions: Fall Restrictions Weight Bearing Restrictions Per Provider Order: No      Mobility  Bed Mobility Overal bed mobility: Needs Assistance Bed Mobility: Supine to Sit     Supine to sit: Min assist, HOB elevated     General bed mobility comments: pt indicates R shoulder dysfuction at baseline and requested assist    Transfers Overall transfer level: Needs assistance Equipment used: Rolling walker (2 wheels) Transfers: Sit to/from Stand Sit to Stand: Contact guard assist           General transfer comment: min cues for proper UE and AD placement    Ambulation/Gait Ambulation/Gait assistance: Contact guard assist Gait Distance (Feet): 40 Feet Assistive device: Rolling walker (2 wheels) Gait Pattern/deviations: Step-to pattern, Decreased stance time - left, Antalgic Gait velocity: decreased     General Gait Details: min cues for safety, posture and proper AD management  Stairs Stairs: Yes Stairs assistance: Contact guard assist Stair Management: Two rails Number of Stairs: 3 General stair comments: cues for safety and sequencing with step to pattern  Wheelchair Mobility     Tilt Bed    Modified Rankin (Stroke Patients Only)       Balance Overall balance assessment: Needs assistance, History of Falls Sitting-balance support: Feet supported Sitting balance-Leahy Scale: Good     Standing balance support: Bilateral upper extremity supported, During functional activity, Reliant on assistive device for balance Standing balance-Leahy Scale: Fair  Pertinent Vitals/Pain Pain Assessment Pain Assessment: 0-10 Pain Score: 5  Pain Location: L hip and LE Pain Descriptors / Indicators: Aching, Constant, Discomfort, Dull, Guarding Pain Intervention(s): Limited activity within patient's tolerance, Monitored  during session, Premedicated before session, Repositioned, Ice applied    Home Living Family/patient expects to be discharged to:: Private residence Living Arrangements: Spouse/significant other Available Help at Discharge: Family Type of Home: House Home Access: Stairs to enter Entrance Stairs-Rails:  (hand hold by door on L side) Entrance Stairs-Number of Steps: 3   Home Layout: One level Home Equipment: Cane - single point;Shower seat;BSC/3in1      Prior Function Prior Level of Function : Independent/Modified Independent;Driving;History of Falls (last six months) (2)             Mobility Comments: mod I with use of SPC for all mobility tasks and ADLs       Extremity/Trunk Assessment        Lower Extremity Assessment Lower Extremity Assessment: LLE deficits/detail LLE Deficits / Details: ankle DF/PF 5/5    Cervical / Trunk Assessment Cervical / Trunk Assessment: Normal  Communication   Communication Communication: No apparent difficulties    Cognition Arousal: Alert Behavior During Therapy: WFL for tasks assessed/performed   PT - Cognitive impairments: No apparent impairments                         Following commands: Intact       Cueing       General Comments      Exercises Total Joint Exercises Ankle Circles/Pumps: AROM, Both, 5 reps Quad Sets: AROM, Left, 5 reps Heel Slides: AROM, Left, 5 reps Hip ABduction/ADduction: AROM, Left, 5 reps Long Arc Quad: AROM, Left, 5 reps Knee Flexion: AROM, Left, 5 reps Marching in Standing: AROM, Left, 5 reps Standing Hip Extension: AROM, Left, 5 reps   Assessment/Plan    PT Assessment Patient needs continued PT services  PT Problem List Decreased strength;Decreased range of motion;Decreased activity tolerance;Decreased balance;Decreased mobility;Pain       PT Treatment Interventions DME instruction;Gait training;Stair training;Functional mobility training;Therapeutic activities;Therapeutic  exercise;Balance training;Neuromuscular re-education;Patient/family education;Modalities    PT Goals (Current goals can be found in the Care Plan section)  Acute Rehab PT Goals Patient Stated Goal: to be able to do everything I was before and chase my wife around PT Goal Formulation: With patient Time For Goal Achievement: 03/30/24 Potential to Achieve Goals: Good    Frequency 7X/week     Co-evaluation               AM-PAC PT "6 Clicks" Mobility  Outcome Measure                  End of Session Equipment Utilized During Treatment: Gait belt Activity Tolerance: Patient tolerated treatment well Patient left: in chair;with call bell/phone within reach;with family/visitor present Nurse Communication: Mobility status PT Visit Diagnosis: Unsteadiness on feet (R26.81);Other abnormalities of gait and mobility (R26.89);Muscle weakness (generalized) (M62.81);History of falling (Z91.81);Difficulty in walking, not elsewhere classified (R26.2);Pain Pain - Right/Left: Left Pain - part of body: Hip;Leg    Time: 1610-9604 PT Time Calculation (min) (ACUTE ONLY): 57 min   Charges:   PT Evaluation $PT Eval Low Complexity: 1 Low PT Treatments $Gait Training: 8-22 mins $Therapeutic Exercise: 8-22 mins $Therapeutic Activity: 8-22 mins PT General Charges $$ ACUTE PT VISIT: 1 Visit         Cary Clarks, PT Acute Rehab   Leia Pun  [place]ley Whan 03/30/2024, 1:48 PM

## 2024-03-30 NOTE — Discharge Instructions (Signed)

## 2024-03-31 ENCOUNTER — Encounter (HOSPITAL_COMMUNITY): Payer: Self-pay | Admitting: Orthopedic Surgery

## 2024-04-02 DIAGNOSIS — R262 Difficulty in walking, not elsewhere classified: Secondary | ICD-10-CM | POA: Diagnosis not present

## 2024-04-02 DIAGNOSIS — M1612 Unilateral primary osteoarthritis, left hip: Secondary | ICD-10-CM | POA: Diagnosis not present

## 2024-04-02 DIAGNOSIS — M6281 Muscle weakness (generalized): Secondary | ICD-10-CM | POA: Diagnosis not present

## 2024-04-14 DIAGNOSIS — M1612 Unilateral primary osteoarthritis, left hip: Secondary | ICD-10-CM | POA: Diagnosis not present

## 2024-04-14 DIAGNOSIS — M6281 Muscle weakness (generalized): Secondary | ICD-10-CM | POA: Diagnosis not present

## 2024-04-14 DIAGNOSIS — R262 Difficulty in walking, not elsewhere classified: Secondary | ICD-10-CM | POA: Diagnosis not present

## 2024-04-22 DIAGNOSIS — M6281 Muscle weakness (generalized): Secondary | ICD-10-CM | POA: Diagnosis not present

## 2024-04-22 DIAGNOSIS — R262 Difficulty in walking, not elsewhere classified: Secondary | ICD-10-CM | POA: Diagnosis not present

## 2024-04-22 DIAGNOSIS — M1612 Unilateral primary osteoarthritis, left hip: Secondary | ICD-10-CM | POA: Diagnosis not present

## 2024-05-03 DIAGNOSIS — M6281 Muscle weakness (generalized): Secondary | ICD-10-CM | POA: Diagnosis not present

## 2024-05-03 DIAGNOSIS — M1612 Unilateral primary osteoarthritis, left hip: Secondary | ICD-10-CM | POA: Diagnosis not present

## 2024-05-03 DIAGNOSIS — R262 Difficulty in walking, not elsewhere classified: Secondary | ICD-10-CM | POA: Diagnosis not present

## 2024-05-13 DIAGNOSIS — M1612 Unilateral primary osteoarthritis, left hip: Secondary | ICD-10-CM | POA: Diagnosis not present

## 2024-05-25 DIAGNOSIS — R7303 Prediabetes: Secondary | ICD-10-CM | POA: Diagnosis not present

## 2024-05-25 DIAGNOSIS — N529 Male erectile dysfunction, unspecified: Secondary | ICD-10-CM | POA: Diagnosis not present

## 2024-05-25 DIAGNOSIS — I10 Essential (primary) hypertension: Secondary | ICD-10-CM | POA: Diagnosis not present

## 2024-05-25 DIAGNOSIS — I700 Atherosclerosis of aorta: Secondary | ICD-10-CM | POA: Diagnosis not present

## 2024-05-25 DIAGNOSIS — Z0000 Encounter for general adult medical examination without abnormal findings: Secondary | ICD-10-CM | POA: Diagnosis not present

## 2024-05-25 DIAGNOSIS — F17200 Nicotine dependence, unspecified, uncomplicated: Secondary | ICD-10-CM | POA: Diagnosis not present

## 2024-05-25 DIAGNOSIS — M1612 Unilateral primary osteoarthritis, left hip: Secondary | ICD-10-CM | POA: Diagnosis not present

## 2024-05-25 DIAGNOSIS — G8929 Other chronic pain: Secondary | ICD-10-CM | POA: Diagnosis not present

## 2024-05-25 DIAGNOSIS — E785 Hyperlipidemia, unspecified: Secondary | ICD-10-CM | POA: Diagnosis not present

## 2024-05-25 DIAGNOSIS — Z1331 Encounter for screening for depression: Secondary | ICD-10-CM | POA: Diagnosis not present

## 2024-05-25 DIAGNOSIS — G621 Alcoholic polyneuropathy: Secondary | ICD-10-CM | POA: Diagnosis not present

## 2024-05-25 DIAGNOSIS — J449 Chronic obstructive pulmonary disease, unspecified: Secondary | ICD-10-CM | POA: Diagnosis not present

## 2024-06-09 DIAGNOSIS — M1612 Unilateral primary osteoarthritis, left hip: Secondary | ICD-10-CM | POA: Diagnosis not present

## 2024-06-29 DIAGNOSIS — D2272 Melanocytic nevi of left lower limb, including hip: Secondary | ICD-10-CM | POA: Diagnosis not present

## 2024-06-29 DIAGNOSIS — D485 Neoplasm of uncertain behavior of skin: Secondary | ICD-10-CM | POA: Diagnosis not present

## 2024-06-29 DIAGNOSIS — B078 Other viral warts: Secondary | ICD-10-CM | POA: Diagnosis not present

## 2024-06-29 DIAGNOSIS — B351 Tinea unguium: Secondary | ICD-10-CM | POA: Diagnosis not present

## 2024-06-29 DIAGNOSIS — L820 Inflamed seborrheic keratosis: Secondary | ICD-10-CM | POA: Diagnosis not present

## 2024-07-16 DIAGNOSIS — L98499 Non-pressure chronic ulcer of skin of other sites with unspecified severity: Secondary | ICD-10-CM | POA: Diagnosis not present

## 2024-07-16 DIAGNOSIS — D485 Neoplasm of uncertain behavior of skin: Secondary | ICD-10-CM | POA: Diagnosis not present

## 2024-07-16 DIAGNOSIS — C4372 Malignant melanoma of left lower limb, including hip: Secondary | ICD-10-CM | POA: Diagnosis not present

## 2024-08-03 DIAGNOSIS — C4372 Malignant melanoma of left lower limb, including hip: Secondary | ICD-10-CM | POA: Diagnosis not present

## 2024-08-10 DIAGNOSIS — L988 Other specified disorders of the skin and subcutaneous tissue: Secondary | ICD-10-CM | POA: Diagnosis not present

## 2024-08-10 DIAGNOSIS — C4372 Malignant melanoma of left lower limb, including hip: Secondary | ICD-10-CM | POA: Diagnosis not present
